# Patient Record
Sex: Female | Born: 1935
Health system: Southern US, Community
[De-identification: ages and names within clinical notes are randomized; demographics above are authoritative.]

## PROBLEM LIST (undated history)

## (undated) DIAGNOSIS — E039 Hypothyroidism, unspecified: Secondary | ICD-10-CM

## (undated) HISTORY — PX: NO PAST SURGERIES: SHX2092

## (undated) HISTORY — PX: BREAST BIOPSY: SHX20

---

## 2011-09-20 ENCOUNTER — Ambulatory Visit
Admission: RE | Admit: 2011-09-20 | Discharge: 2011-09-20 | Disposition: A | Discharge status: Home / Self Care | Payer: Medicare Other | Source: Ambulatory Visit | Attending: Internal Medicine | Admitting: Internal Medicine

## 2011-09-20 ENCOUNTER — Other Ambulatory Visit: Payer: Self-pay | Admitting: Internal Medicine

## 2011-09-20 DIAGNOSIS — M25519 Pain in unspecified shoulder: Secondary | ICD-10-CM

## 2013-01-07 ENCOUNTER — Ambulatory Visit: Payer: Self-pay | Admitting: *Deleted

## 2014-03-28 ENCOUNTER — Ambulatory Visit: Payer: Self-pay | Admitting: Physician Assistant

## 2016-03-27 ENCOUNTER — Ambulatory Visit
Admission: EM | Admit: 2016-03-27 | Discharge: 2016-03-27 | Disposition: A | Discharge status: Home / Self Care | Payer: Medicare Other | Attending: Family Medicine | Admitting: Family Medicine

## 2016-03-27 DIAGNOSIS — T148 Other injury of unspecified body region: Secondary | ICD-10-CM

## 2016-03-27 DIAGNOSIS — W57XXXA Bitten or stung by nonvenomous insect and other nonvenomous arthropods, initial encounter: Secondary | ICD-10-CM | POA: Diagnosis not present

## 2016-03-27 DIAGNOSIS — S80862A Insect bite (nonvenomous), left lower leg, initial encounter: Secondary | ICD-10-CM

## 2016-03-27 HISTORY — DX: Hypothyroidism, unspecified: E03.9

## 2016-03-27 MED ORDER — DOXYCYCLINE HYCLATE 100 MG PO CAPS: 100.0000 mg | ORAL_CAPSULE | Freq: Two times a day (BID) | ORAL | Status: DC

## 2016-03-27 NOTE — ED Notes (Signed)
Patient complains of a tick bite that is still embedded in skin. Located on left leg. Patient states that she first noticed the tick last week but, thought that it may be a mole.

## 2016-03-27 NOTE — ED Provider Notes (Signed)
CSN: [id number]     Arrival date & time 03/27/16  1452 History   First MD Initiated Contact with Patient 03/27/16 1512     Chief Complaint  Patient presents with  . Tick Removal   (Consider location/radiation/quality/duration/timing/severity/associated sxs/prior Treatment) HPI Comments: Patient presents with a tick still attached to left inner calf near knee for over 1 week. Has tried to use alcohol and other liquids to remove tick. Very itchy and now tick slightly embedded into skin with some bleeding and more open wound. No signs of redness or infection surrounding tick bite. No fever or unusual arthralgias.   The history is provided by the patient.    Past Medical History  Diagnosis Date  . Hypothyroid    Past Surgical History  Procedure Laterality Date  . No past surgeries     History reviewed. No pertinent family history. Social History  Substance Use Topics  . Smoking status: Never Smoker   . Smokeless tobacco: None  . Alcohol Use: No   OB History    No data available     Review of Systems  Constitutional: Negative for fever and fatigue.  Skin: Positive for wound.    Allergies  Review of patient's allergies indicates no known allergies.  Home Medications   Prior to Admission medications   Medication Sig Start Date End Date Taking? Authorizing Provider  levothyroxine (SYNTHROID, LEVOTHROID) 112 MCG tablet Take 112 mcg by mouth daily before breakfast.   Yes Historical Provider, MD  doxycycline (VIBRAMYCIN) 100 MG capsule Take 1 capsule (100 mg total) by mouth 2 (two) times daily. For 1 day 03/27/16   Sudie GrumblingAnn Berry Nakai Yard, NP   Meds Ordered and Administered this Visit  Medications - No data to display  BP 134/60 mmHg  Pulse 91  Temp(Src) 98 F (36.7 C) (Tympanic)  Resp 16  Ht 5\' 9"  (1.753 m)  SpO2 98% No data found.   Physical Exam  Constitutional: She is oriented to person, place, and time. She appears well-developed and well-nourished.  Cardiovascular:  Normal rate, regular rhythm and normal heart sounds.   Neurological: She is alert and oriented to person, place, and time.  Skin: Skin is warm and dry.     Open area on left inner upper calf near knee about 3mm in diameter. Tick still present in wound. Slight healing of skin around front of tick. No redness or swelling. Minimal tenderness.      ED Course  Procedures (including critical care time) Cleaned area with alcohol. Used tweezers to remove tick from skin/wound intact. Applied antibiotic ointment and covered with bandaid.   Labs Review Labs Reviewed - No data to display  Imaging Review No results found.   Visual Acuity Review  Right Eye Distance:   Left Eye Distance:   Bilateral Distance:    Right Eye Near:   Left Eye Near:    Bilateral Near:         MDM   1. Tick bite of calf, left, initial encounter   2. Tick bite with subsequent removal of tick    Discussed since tick has been present for more than 1 week but no signs of rash or infection, that she could take a prophylactic dose of Doxycycline to prevent infection. Prescribed 1 day of Doxycycline. Continue to monitor area for redness, swelling or drainage and if they occur, follow-up with her PCP for further evaluation.     Sudie GrumblingAnn Berry Mahlani Berninger, NP 03/27/16 [phone]

## 2016-03-27 NOTE — Discharge Instructions (Signed)
Take Doxycycline twice a day for 1 day to prevent infection. If redness, swelling or discharge occur at area of tick bite, need to see primary care provider for further evaluation.  Tick Bite Information Ticks are insects that attach themselves to the skin and draw blood for food. There are various types of ticks. Common types include wood ticks and deer ticks. Most ticks live in shrubs and grassy areas. Ticks can climb onto your body when you make contact with leaves or grass where the tick is waiting. The most common places on the body for ticks to attach themselves are the scalp, neck, armpits, waist, and groin. Most tick bites are harmless, but sometimes ticks carry germs that cause diseases. These germs can be spread to a person during the tick's feeding process. The chance of a disease spreading through a tick bite depends on:   The type of tick.  Time of year.   How long the tick is attached.   Geographic location.  HOW CAN YOU PREVENT TICK BITES? Take these steps to help prevent tick bites when you are outdoors:  Wear protective clothing. Long sleeves and long pants are best.   Wear white clothes so you can see ticks more easily.  Tuck your pant legs into your socks.   If walking on a trail, stay in the middle of the trail to avoid brushing against bushes.  Avoid walking through areas with long grass.  Put insect repellent on all exposed skin and along boot tops, pant legs, and sleeve cuffs.   Check clothing, hair, and skin repeatedly and before going inside.   Brush off any ticks that are not attached.  Take a shower or bath as soon as possible after being outdoors.  WHAT IS THE PROPER WAY TO REMOVE A TICK? Ticks should be removed as soon as possible to help prevent diseases caused by tick bites. 1. If latex gloves are available, put them on before trying to remove a tick.  2. Using fine-point tweezers, grasp the tick as close to the skin as possible. You may  also use curved forceps or a tick removal tool. Grasp the tick as close to its head as possible. Avoid grasping the tick on its body. 3. Pull gently with steady upward pressure until the tick lets go. Do not twist the tick or jerk it suddenly. This may break off the tick's head or mouth parts. 4. Do not squeeze or crush the tick's body. This could force disease-carrying fluids from the tick into your body.  5. After the tick is removed, wash the bite area and your hands with soap and water or other disinfectant such as alcohol. 6. Apply a small amount of antiseptic cream or ointment to the bite site.  7. Wash and disinfect any instruments that were used.  Do not try to remove a tick by applying a hot match, petroleum jelly, or fingernail polish to the tick. These methods do not work and may increase the chances of disease being spread from the tick bite.  WHEN SHOULD YOU SEEK MEDICAL CARE? Contact your health care provider if you are unable to remove a tick from your skin or if a part of the tick breaks off and is stuck in the skin.  After a tick bite, you need to be aware of signs and symptoms that could be related to diseases spread by ticks. Contact your health care provider if you develop any of the following in the days or weeks  after the tick bite:  Unexplained fever.  Rash. A circular rash that appears days or weeks after the tick bite may indicate the possibility of Lyme disease. The rash may resemble a target with a bull's-eye and may occur at a different part of your body than the tick bite.  Redness and swelling in the area of the tick bite.   Tender, swollen lymph glands.   Diarrhea.   Weight loss.   Cough.   Fatigue.   Muscle, joint, or bone pain.   Abdominal pain.   Headache.   Lethargy or a change in your level of consciousness.  Difficulty walking or moving your legs.   Numbness in the legs.   Paralysis.  Shortness of breath.   Confusion.    Repeated vomiting.    This information is not intended to replace advice given to you by your health care provider. Make sure you discuss any questions you have with your health care provider.   Document Released: 09/16/2000 Document Revised: 10/10/2014 Document Reviewed: 02/27/2013 Elsevier Interactive Patient Education [employer]2016 Elsevier Inc.

## 2017-06-12 DIAGNOSIS — M179 Osteoarthritis of knee, unspecified: Secondary | ICD-10-CM | POA: Insufficient documentation

## 2017-06-12 DIAGNOSIS — M1710 Unilateral primary osteoarthritis, unspecified knee: Secondary | ICD-10-CM | POA: Insufficient documentation

## 2017-09-20 ENCOUNTER — Other Ambulatory Visit: Payer: Self-pay | Admitting: Nurse Practitioner

## 2017-09-20 MED ORDER — LEVOTHYROXINE SODIUM 50 MCG PO TABS: 50.0000 ug | ORAL_TABLET | Freq: Every day | ORAL | 1 refills | Status: DC

## 2017-11-06 ENCOUNTER — Ambulatory Visit: Payer: Medicare Other | Admitting: Nurse Practitioner

## 2017-11-06 ENCOUNTER — Encounter: Payer: Self-pay | Admitting: Nurse Practitioner

## 2017-11-06 VITALS — BP 122/60 | HR 70 | Resp 16 | Ht 68.0 in | Wt 222.6 lb

## 2017-11-06 DIAGNOSIS — N390 Urinary tract infection, site not specified: Secondary | ICD-10-CM

## 2017-11-06 DIAGNOSIS — E039 Hypothyroidism, unspecified: Secondary | ICD-10-CM

## 2017-11-06 DIAGNOSIS — Z0001 Encounter for general adult medical examination with abnormal findings: Secondary | ICD-10-CM

## 2017-11-06 LAB — POCT URINALYSIS DIPSTICK
Bilirubin, UA: NEGATIVE
Blood, UA: NEGATIVE
Glucose, UA: NEGATIVE
KETONES UA: NEGATIVE
NITRITE UA: NEGATIVE
PH UA: 6.5 (ref 5.0–8.0)
PROTEIN UA: NEGATIVE
Spec Grav, UA: <=1.005 — AB (ref 1.010–1.025)
UROBILINOGEN UA: 0.2 U/dL

## 2017-11-06 NOTE — Progress Notes (Signed)
[facility] [address] [place], [place] [address]  Internal MEDICINE  Office Visit Note  Patient Name: Karen Hess  [id number]  [id number]  Date of Service: 11/12/2017  No chief complaint on file.    The patient is here for routine health maintenance exam. She has no concerns or complaints today. She is due to have thyroid levels checked.    Pt is here for routine health maintenance examination  Current Medication: Outpatient Encounter Medications as of 11/06/2017  Medication Sig  . levothyroxine (SYNTHROID, LEVOTHROID) 50 MCG tablet Take 1 tablet (50 mcg total) by mouth daily before breakfast.  . liothyronine (CYTOMEL) 25 MCG tablet Take 25 mcg by mouth daily. Take 1/2 tab daily.  . [DISCONTINUED] doxycycline (VIBRAMYCIN) 100 MG capsule Take 1 capsule (100 mg total) by mouth 2 (two) times daily. For 1 day  . amoxicillin-clavulanate (AUGMENTIN) 875-125 MG tablet Take 1 tablet by mouth 2 (two) times daily.   No facility-administered encounter medications on file as of 11/06/2017.     Surgical History: Past Surgical History:  Procedure Laterality Date  . BREAST BIOPSY    . NO PAST SURGERIES      Medical History: Past Medical History:  Diagnosis Date  . Hypothyroid     Family History: Family History  Problem Relation Age of Onset  . Arthritis Mother       Review of Systems  Constitutional: Negative for activity change, appetite change, fatigue and unexpected weight change.  HENT: Negative for postnasal drip, rhinorrhea, sore throat and voice change.   Eyes: Negative.   Respiratory: Negative for cough and wheezing.   Cardiovascular: Negative for chest pain and palpitations.  Endocrine: Negative for cold intolerance, heat intolerance, polydipsia, polyphagia and polyuria.  Musculoskeletal: Negative for arthralgias, gait problem and myalgias.  Allergic/Immunologic: Negative for environmental allergies, food allergies and immunocompromised state.   Neurological: Negative for weakness and headaches.  Hematological: Negative.      Today's Vitals   11/06/17 1424  BP: 122/60  Pulse: 70  Resp: 16  SpO2: 97%  Weight: 222 lb 9.6 oz (101 kg)  Height: 5\' 8"  (1.727 m)    Physical Exam  Constitutional: She is oriented to person, place, and time. She appears well-developed and well-nourished. No distress.  HENT:  Head: Normocephalic and atraumatic.  Mouth/Throat: Oropharynx is clear and moist. No oropharyngeal exudate.  Eyes: EOM are normal. Pupils are equal, round, and reactive to light.  Neck: Normal range of motion. Neck supple. No JVD present. Carotid bruit is not present. No tracheal deviation present. No thyromegaly present.  Cardiovascular: Normal rate, regular rhythm, normal heart sounds and intact distal pulses. Exam reveals no gallop and no friction rub.  No murmur heard. Pulmonary/Chest: Effort normal and breath sounds normal. No respiratory distress. She has no wheezes. She has no rales. She exhibits no tenderness.  Abdominal: Soft. Bowel sounds are normal. There is no tenderness.  Musculoskeletal: Normal range of motion.  Lymphadenopathy:    She has no cervical adenopathy.  Neurological: She is alert and oriented to person, place, and time. No cranial nerve deficit.  Skin: Skin is warm and dry. She is not diaphoretic.  Psychiatric: She has a normal mood and affect. Her behavior is normal. Judgment and thought content normal.  Nursing note and vitals reviewed.    LABS: Recent Results (from the past 2160 hour(s))  POCT Urinalysis Dipstick     Status: Abnormal   Collection Time: 11/06/17  2:41 PM  Result Value Ref Range  Color, UA     Clarity, UA     Glucose, UA negative    Bilirubin, UA negative    Ketones, UA negative    Spec Grav, UA <=1.005 (A) 1.010 - 1.025   Blood, UA negative    pH, UA 6.5 5.0 - 8.0   Protein, UA negative    Urobilinogen, UA 0.2 0.2 or 1.0 E.U./dL   Nitrite, UA negative    Leukocytes,  UA Moderate (2+) (A) Negative   Appearance     Odor    CULTURE, URINE COMPREHENSIVE     Status: None   Collection Time: 11/06/17  4:36 PM  Result Value Ref Range   Urine Culture, Comprehensive Final report    Organism ID, Bacteria Comment     Comment: Mixed urogenital flora 10,000-25,000 colony forming units per mL     Assessment/Plan:  1. Encounter for general adult medical examination with abnormal findings Annual wellness visit today - POCT Urinalysis Dipstick  2. Acquired hypothyroidism - TSH + free T4 - T3 Adjust thyroid medications as indicated.   She should return to the office in 6 months and sooner if needed.    General Counseling: Karen LeavensBarbara verbalizes understanding of the findings of todays visit and agrees with plan of treatment. I have discussed any further diagnostic evaluation that may be needed or ordered today. We also reviewed her medications today. she has been encouraged to call the office with any questions or concerns that should arise related to todays visit.   This patient was seen by Karen GrosHeather Suzy Kugel, FNP- C in Collaboration with Dr Lyndon CodeFozia M Khan as a part of collaborative care agreement  Orders Placed This Encounter  Procedures  . CULTURE, URINE COMPREHENSIVE  . TSH + free T4  . T3  . POCT Urinalysis Dipstick      Time spent: 7430 Minutes   Lyndon CodeFozia M Khan, MD  Internal Medicine

## 2017-11-09 LAB — CULTURE, URINE COMPREHENSIVE

## 2017-11-12 MED ORDER — AMOXICILLIN-POT CLAVULANATE 875-125 MG PO TABS: 1.0000 | ORAL_TABLET | Freq: Two times a day (BID) | ORAL | 0 refills | Status: DC

## 2017-11-15 ENCOUNTER — Telehealth: Payer: Self-pay

## 2017-11-15 DIAGNOSIS — E039 Hypothyroidism, unspecified: Secondary | ICD-10-CM | POA: Insufficient documentation

## 2017-11-15 DIAGNOSIS — N390 Urinary tract infection, site not specified: Secondary | ICD-10-CM | POA: Insufficient documentation

## 2017-11-15 NOTE — Telephone Encounter (Signed)
Pt advised for urine culure result

## 2017-11-15 NOTE — Telephone Encounter (Signed)
-----   Message from Carlean JewsHeather E Boscia, NP sent at 11/12/2017  7:18 PM EST ----- Please let the patient know that urine sample, taken during her last appointment did show evidence of infection. Just normal flora. Added round of augmentin 875mg  twice daily for 10 days. Sent to her pharmacy. thanks

## 2018-01-22 ENCOUNTER — Other Ambulatory Visit: Payer: Self-pay

## 2018-01-22 MED ORDER — MELOXICAM 7.5 MG PO TABS: ORAL_TABLET | ORAL | 2 refills | Status: DC

## 2018-02-05 ENCOUNTER — Ambulatory Visit
Admission: RE | Admit: 2018-02-05 | Discharge: 2018-02-05 | Disposition: A | Discharge status: Home / Self Care | Payer: Medicare Other | Source: Ambulatory Visit | Attending: Nurse Practitioner | Admitting: Nurse Practitioner

## 2018-02-05 ENCOUNTER — Ambulatory Visit: Payer: Medicare Other | Admitting: Nurse Practitioner

## 2018-02-05 ENCOUNTER — Encounter: Payer: Self-pay | Admitting: Nurse Practitioner

## 2018-02-05 VITALS — BP 111/69 | HR 74 | Resp 16 | Ht 69.0 in | Wt 226.0 lb

## 2018-02-05 DIAGNOSIS — B352 Tinea manuum: Secondary | ICD-10-CM

## 2018-02-05 DIAGNOSIS — B353 Tinea pedis: Secondary | ICD-10-CM | POA: Diagnosis not present

## 2018-02-05 DIAGNOSIS — M25561 Pain in right knee: Secondary | ICD-10-CM

## 2018-02-05 DIAGNOSIS — B351 Tinea unguium: Secondary | ICD-10-CM | POA: Insufficient documentation

## 2018-02-05 DIAGNOSIS — M7989 Other specified soft tissue disorders: Secondary | ICD-10-CM | POA: Insufficient documentation

## 2018-02-05 MED ORDER — TERBINAFINE HCL 1 % EX CREA: 1.0000 "application " | TOPICAL_CREAM | Freq: Two times a day (BID) | CUTANEOUS | 2 refills | Status: DC

## 2018-02-05 NOTE — Progress Notes (Signed)
[facility] [address] [place], [place] [address]  Internal MEDICINE  Office Visit Note  Patient Name: Karen Hess  [id number]  [id number]  Date of Service: 02/05/2018  Chief Complaint  Patient presents with  . Leg Swelling    right leg pain  . Nail Problem    right great toe.     The patient is c/o right leg pain. Hurts from mid thigh down to the right ankle. Pain is most severe in her right knee. There is moderate swelling present. ROM is intact, though she says it hurts to move. She rates pain as 3/10 in severity.  She also states that the toenail of her right great toe has fallen off. There is thick nail bed underneath. Does not hurt or interfere with walking.   Pt is here for a sick visit.     Current Medication:  Outpatient Encounter Medications as of 02/05/2018  Medication Sig  . amoxicillin-clavulanate (AUGMENTIN) 875-125 MG tablet Take 1 tablet by mouth 2 (two) times daily. (Patient not taking: Reported on 02/05/2018)  . levothyroxine (SYNTHROID, LEVOTHROID) 50 MCG tablet Take 1 tablet (50 mcg total) by mouth daily before breakfast.  . liothyronine (CYTOMEL) 25 MCG tablet Take 25 mcg by mouth daily. Take 1/2 tab daily.  . meloxicam (MOBIC) 7.5 MG tablet TAKE 1 TAB PO TWICE A DAY  . terbinafine (LAMISIL) 1 % cream Apply 1 application topically 2 (two) times daily.   No facility-administered encounter medications on file as of 02/05/2018.       Medical History: Past Medical History:  Diagnosis Date  . Hypothyroid      Vital Signs: BP 111/69   Pulse 74   Resp 16   Ht  (1.753 m)   Wt 226 lb (102.5 kg)   SpO2 95%   BMI 33.37 kg/m    Review of Systems  Constitutional: Negative for activity change, chills, fatigue and unexpected weight change.  HENT: Negative for congestion, postnasal drip, rhinorrhea, sneezing and sore throat.   Eyes: Negative.  Negative for redness.  Respiratory: Negative for cough, shortness of breath and wheezing.    Cardiovascular: Negative for chest pain and palpitations.  Gastrointestinal: Negative for abdominal pain, constipation, diarrhea, nausea and vomiting.  Endocrine: Negative for cold intolerance, heat intolerance, polydipsia, polyphagia and polyuria.  Genitourinary: Negative for dysuria and frequency.  Musculoskeletal: Positive for arthralgias and myalgias. Negative for back pain, joint swelling and neck pain.  Skin: Negative for rash.       Toenail from right great toe has fallen off. Remaining nail is thick and discolored.   Allergic/Immunologic: Negative for environmental allergies.  Neurological: Negative for dizziness, tremors, numbness and headaches.  Hematological: Negative for adenopathy. Does not bruise/bleed easily.  Psychiatric/Behavioral: Negative for behavioral problems (Depression), dysphoric mood, sleep disturbance and suicidal ideas. The patient is not nervous/anxious.     Physical Exam  Constitutional: She is oriented to person, place, and time. She appears well-developed and well-nourished.  HENT:  Head: Normocephalic and atraumatic.  Eyes: Pupils are equal, round, and reactive to light. Conjunctivae and EOM are normal.  Neck: Normal range of motion. Neck supple.  Cardiovascular: Normal rate, regular rhythm, normal heart sounds and intact distal pulses.  Pulmonary/Chest: Effort normal and breath sounds normal.  Abdominal: Soft. Bowel sounds are normal. There is no tenderness.  Musculoskeletal:  Moderate pain and swelling of right knee. There is more significant swelling along medial aspect of the knee. There is palpable collection of fluid just  posterior of the right knee. Very tender with palpation. Patient can bend and flex the knee without issue. No limp noted with walking.   Neurological: She is alert and oriented to person, place, and time. No cranial nerve deficit.  Skin: Skin is warm and dry.  Great toenail of right foot is very thick and discolored. No erythema or  warmth around the nail or nailbed.   Psychiatric: She has a normal mood and affect. Her behavior is normal. Judgment and thought content normal.  Nursing note and vitals reviewed.   Assessment/Plan: 1. Pain in joint of right knee Recommended more regular use of meloxicam to reduce pain and inflammation of knee. Rest and ice the knee when possible. Will get x-ray for further evaluation. Refer to orthopedics as indicated.  - DG Knee Complete 4 Views Right; Future  2. Tinea manuum, pedis, and unguium - terbinafine (LAMISIL) 1 % cream; Apply 1 application topically 2 (two) times daily.  Dispense: 30 g; Refill: 2   General Counseling: Karen Hess verbalizes understanding of the findings of todays visit and agrees with plan of treatment. I have discussed any further diagnostic evaluation that may be needed or ordered today. We also reviewed her medications today. she has been encouraged to call the office with any questions or concerns that should arise related to todays visit.  Apply a compressive ACE bandage. Rest and elevate the affected painful area.  Apply cold compresses intermittently as needed.  As pain recedes, begin normal activities slowly as tolerated.  Call if symptoms persist.  This patient was seen by Vincent Gros, FNP- C in Collaboration with Dr Lyndon Code as a part of collaborative care agreement    Orders Placed This Encounter  Procedures  . DG Knee Complete 4 Views Right    Meds ordered this encounter  Medications  . terbinafine (LAMISIL) 1 % cream    Sig: Apply 1 application topically 2 (two) times daily.    Dispense:  30 g    Refill:  2    Order Specific Question:   Supervising Provider    Answer:   Lyndon Code [[id number]]    Time spent: 15 Minutes

## 2018-02-27 ENCOUNTER — Other Ambulatory Visit: Payer: Self-pay | Admitting: Nurse Practitioner

## 2018-02-27 MED ORDER — LIOTHYRONINE SODIUM 25 MCG PO TABS: 25.0000 ug | ORAL_TABLET | Freq: Every day | ORAL | 2 refills | Status: DC

## 2018-03-27 ENCOUNTER — Other Ambulatory Visit: Payer: Self-pay | Admitting: Nurse Practitioner

## 2018-03-27 MED ORDER — LEVOTHYROXINE SODIUM 50 MCG PO TABS: 50.0000 ug | ORAL_TABLET | Freq: Every day | ORAL | 3 refills | Status: DC

## 2018-04-23 ENCOUNTER — Ambulatory Visit: Payer: Medicare Other | Admitting: Adult Health

## 2018-04-23 ENCOUNTER — Encounter: Payer: Self-pay | Admitting: Adult Health

## 2018-04-23 VITALS — BP 132/70 | HR 90 | Temp 97.7°F | Resp 18 | Ht 68.5 in | Wt 215.4 lb

## 2018-04-23 DIAGNOSIS — M542 Cervicalgia: Secondary | ICD-10-CM | POA: Diagnosis not present

## 2018-04-23 DIAGNOSIS — W57XXXA Bitten or stung by nonvenomous insect and other nonvenomous arthropods, initial encounter: Secondary | ICD-10-CM

## 2018-04-23 MED ORDER — DOXYCYCLINE HYCLATE 100 MG PO TABS: 100.0000 mg | ORAL_TABLET | Freq: Two times a day (BID) | ORAL | 0 refills | Status: DC

## 2018-04-23 NOTE — Patient Instructions (Signed)
Place neck pain patient instructions here. Tick Bite Information, Adult Ticks are insects that can bite. Most ticks live in shrubs and grassy areas. They climb onto people and animals that go by. Then they bite. Some ticks carry germs that can make you sick. How can I prevent tick bites?  Use an insect repellent that has 20% or higher of the ingredients DEET, picaridin, or IR3535. Put this insect repellent on: ? Bare skin. ? The tops of your boots. ? Your pant legs. ? The ends of your sleeves.  If you use an insect repellent that has the ingredient permethrin, make sure to follow the instructions on the bottle. Treat the following: ? Clothing. ? Supplies. ? Boots. ? Tents.  Wear long sleeves, long pants, and light colors.  Tuck your pant legs into your socks.  Stay in the middle of the trail.  Try not to walk through long grass.  Before going inside your house, check your clothes, hair, and skin for ticks. Make sure to check your head, neck, armpits, waist, groin, and joint areas.  Check for ticks every day.  When you come indoors: ? Wash your clothes right away. ? Shower right away. ? Dry your clothes in a dryer on high heat for 60 minutes or more. What is the right way to remove a tick? Remove a tick from your skin as soon as possible.  To remove a tick that is crawling on your skin: ? Go outdoors and brush the tick off. ? Use tape or a lint roller.  To remove a tick that is biting: ? Wash your hands. ? If you have latex gloves, put them on. ? Use tweezers, curved forceps, or a tick-removal tool to grasp the tick. Grasp the tick as close to your skin and as close to the tick's head as possible. ? Gently pull up until the tick lets go.  Try to keep the tick's head attached to its body.  Do not twist or jerk the tick.  Do not squeeze or crush the tick.  Do not try to remove a tick with heat, alcohol, petroleum jelly, or fingernail polish. How should I get rid of a  tick? Here are some ways to get rid of a tick that is alive:  Place the tick in rubbing alcohol.  Place the tick in a bag or container you can close tightly.  Wrap the tick tightly in tape.  Flush the tick down the toilet.  Contact a doctor if:  You have symptoms of a disease, such as: ? Pain in a muscle, joint, or bone. ? Trouble walking or moving your legs. ? Numbness in your legs. ? Inability to move (paralysis). ? A red rash that makes a circle (bull's-eye rash). ? Redness and swelling where the tick bit you. ? A fever. ? Throwing up (vomiting) over and over. ? Diarrhea. ? Weight loss. ? Tender and swollen lymph glands. ? Shortness of breath. ? Cough. ? Belly pain (abdominal pain). ? Headache. ? Being more tired than normal. ? A change in how alert (conscious) you are. ? Confusion. Get help right away if:  You cannot remove a tick.  A part of a tick breaks off and gets stuck in your skin.  You are feeling worse. Summary  Ticks may carry germs that can make you sick.  To prevent tick bites, wear long sleeves, long pants, and light colors. Use insect repellent. Follow the instructions on the bottle.  If the tick  is biting, do not try to remove it with heat, alcohol, petroleum jelly, or fingernail polish.  Use tweezers, curved forceps, or a tick-removal tool to grasp the tick. Gently pull up until the tick lets go. Do not twist or jerk the tick. Do not squeeze or crush the tick.  If you have symptoms, contact a doctor. This information is not intended to replace advice given to you by your health care provider. Make sure you discuss any questions you have with your health care provider. Document Released: 12/14/2009 Document Revised: 12/30/2016 Document Reviewed: 12/30/2016 Elsevier Interactive Patient Education  2018 [employer].

## 2018-04-23 NOTE — Progress Notes (Signed)
[facility] [address] [place], [place] [address]  Internal MEDICINE  Office Visit Note  Patient Name: Karen Hess  [id number]  [id number]  Date of Service: 04/29/2018  Chief Complaint  Patient presents with  . Neck Pain    shoulder , and knee pain, dont know if was bit by a tick    HPI Pt is here for a sick visit. She reports she thinks she has a tick bite to her right leg.  She has a small erythematous site present.  She also reports neck pain that has been intermittent x 3 weeks.  It especially gets better through out the day.    Current Medication:  Outpatient Encounter Medications as of 04/23/2018  Medication Sig  . levothyroxine (SYNTHROID, LEVOTHROID) 50 MCG tablet Take 1 tablet (50 mcg total) by mouth daily before breakfast.  . meloxicam (MOBIC) 7.5 MG tablet TAKE 1 TAB PO TWICE A DAY  . terbinafine (LAMISIL) 1 % cream Apply 1 application topically 2 (two) times daily.  . [DISCONTINUED] liothyronine (CYTOMEL) 25 MCG tablet Take 1 tablet (25 mcg total) by mouth daily. Take 1/2 tab daily.  Marland Kitchen doxycycline (VIBRA-TABS) 100 MG tablet Take 1 tablet (100 mg total) by mouth 2 (two) times daily.  . [DISCONTINUED] amoxicillin-clavulanate (AUGMENTIN) 875-125 MG tablet Take 1 tablet by mouth 2 (two) times daily. (Patient not taking: Reported on 02/05/2018)   No facility-administered encounter medications on file as of 04/23/2018.    Medical History: Past Medical History:  Diagnosis Date  . Hypothyroid      Vital Signs: BP 132/70   Pulse 90   Temp 97.7 F (36.5 C)   Resp 18   Ht 5' 8.5" (1.74 m)   Wt 215 lb 6.4 oz (97.7 kg)   SpO2 94%   BMI 32.28 kg/m    Review of Systems  Constitutional: Negative for chills, fatigue and unexpected weight change.  HENT: Negative for congestion, rhinorrhea, sneezing and sore throat.   Eyes: Negative for photophobia, pain and redness.  Respiratory: Negative for cough, chest tightness and shortness of breath.    Cardiovascular: Negative for chest pain and palpitations.  Gastrointestinal: Negative for abdominal pain, constipation, diarrhea, nausea and vomiting.  Endocrine: Negative.   Genitourinary: Negative for dysuria and frequency.  Musculoskeletal: Negative for arthralgias, back pain, joint swelling and neck pain.  Skin: Negative for rash.  Allergic/Immunologic: Negative.   Neurological: Negative for tremors and numbness.  Hematological: Negative for adenopathy. Does not bruise/bleed easily.  Psychiatric/Behavioral: Negative for behavioral problems and sleep disturbance. The patient is not nervous/anxious.     Physical Exam  Constitutional: She is oriented to person, place, and time. She appears well-developed and well-nourished. No distress.  HENT:  Head: Normocephalic and atraumatic.  Mouth/Throat: Oropharynx is clear and moist. No oropharyngeal exudate.  Eyes: Pupils are equal, round, and reactive to light. EOM are normal.  Neck: Normal range of motion. Neck supple. No JVD present. No tracheal deviation present. No thyromegaly present.  Cardiovascular: Normal rate, regular rhythm and normal heart sounds. Exam reveals no gallop and no friction rub.  No murmur heard. Pulmonary/Chest: Effort normal and breath sounds normal. No respiratory distress. She has no wheezes. She has no rales. She exhibits no tenderness.  Abdominal: Soft. There is no tenderness. There is no guarding.  Musculoskeletal: Normal range of motion.  Lymphadenopathy:    She has no cervical adenopathy.  Neurological: She is alert and oriented to person, place, and time. No cranial nerve deficit.  Skin: Skin is warm and dry. She is not diaphoretic.  Psychiatric: She has a normal mood and affect. Her behavior is normal. Judgment and thought content normal.  Nursing note and vitals reviewed.  Assessment/Plan: 1. Tick bite, initial encounter Counciled pt to take with food.  - doxycycline (VIBRA-TABS) 100 MG tablet; Take 1  tablet (100 mg total) by mouth 2 (two) times daily.  Dispense: 28 tablet; Refill: 0  2. Neck pain Encouraged pt to use topical pain relief such as Icy hot, or Biofreeze.  Use hot pack for no more than 20 minutes per session. Also discussed NSAID use, pt declined to take them at this time.  Pt also declined X-ray a this time.     General Counseling: Karen LeavensBarbara verbalizes understanding of the findings of todays visit and agrees with plan of treatment. I have discussed any further diagnostic evaluation that may be needed or ordered today. We also reviewed her medications today. she has been encouraged to call the office with any questions or concerns that should arise related to todays visit.   No orders of the defined types were placed in this encounter.   Meds ordered this encounter  Medications  . doxycycline (VIBRA-TABS) 100 MG tablet    Sig: Take 1 tablet (100 mg total) by mouth 2 (two) times daily.    Dispense:  28 tablet    Refill:  0    Time spent: 25 Minutes  This patient was seen by Blima LedgerAdam Chrles Selley AGNP-C in Collaboration with Dr Lyndon CodeFozia M Khan as a part of collaborative care agreement

## 2018-04-25 ENCOUNTER — Other Ambulatory Visit: Payer: Self-pay | Admitting: Nurse Practitioner

## 2018-04-25 ENCOUNTER — Other Ambulatory Visit: Payer: Self-pay

## 2018-04-25 DIAGNOSIS — E039 Hypothyroidism, unspecified: Secondary | ICD-10-CM

## 2018-04-25 MED ORDER — LIOTHYRONINE SODIUM 25 MCG PO TABS: 12.5000 ug | ORAL_TABLET | Freq: Every day | ORAL | 3 refills | Status: DC

## 2018-04-25 NOTE — Progress Notes (Signed)
Updated her rx for liothyronine. Taking 12.5mg  (1/2 tablet daily).

## 2018-05-07 ENCOUNTER — Ambulatory Visit: Payer: Self-pay | Admitting: Nurse Practitioner

## 2018-05-07 ENCOUNTER — Encounter: Payer: Self-pay | Admitting: Nurse Practitioner

## 2018-05-07 ENCOUNTER — Ambulatory Visit: Payer: Medicare Other | Admitting: Nurse Practitioner

## 2018-05-07 VITALS — BP 106/61 | HR 82 | Resp 16 | Ht 68.5 in | Wt 215.0 lb

## 2018-05-07 DIAGNOSIS — S80861D Insect bite (nonvenomous), right lower leg, subsequent encounter: Principal | ICD-10-CM

## 2018-05-07 DIAGNOSIS — L089 Local infection of the skin and subcutaneous tissue, unspecified: Secondary | ICD-10-CM

## 2018-05-07 DIAGNOSIS — W57XXXD Bitten or stung by nonvenomous insect and other nonvenomous arthropods, subsequent encounter: Principal | ICD-10-CM

## 2018-05-07 MED ORDER — MUPIROCIN 2 % EX OINT: TOPICAL_OINTMENT | CUTANEOUS | 1 refills | Status: DC

## 2018-05-07 NOTE — Progress Notes (Signed)
[facility] [address] [place], [place] [address]  Internal MEDICINE  Office Visit Note  Patient Name: Karen Hess  [id number]  [id number]  Date of Service: 05/15/2018  Chief Complaint  Patient presents with  . Insect Bite    follow up    The patient has small, round, red lesion on the lower aspect of her right leg. Noticed it last week. States that it does not hurt and does not itch. Was seen for this last week. Was started on doxycycline. She states that the lesion is unchanged. Has not improved, but has not gotten any worse.       Current Medication: Outpatient Encounter Medications as of 05/07/2018  Medication Sig  . doxycycline (VIBRA-TABS) 100 MG tablet Take 1 tablet (100 mg total) by mouth 2 (two) times daily.  Marland Kitchen levothyroxine (SYNTHROID, LEVOTHROID) 50 MCG tablet Take 1 tablet (50 mcg total) by mouth daily before breakfast.  . liothyronine (CYTOMEL) 25 MCG tablet Take 0.5 tablets (12.5 mcg total) by mouth daily.  . meloxicam (MOBIC) 7.5 MG tablet TAKE 1 TAB PO TWICE A DAY  . mupirocin ointment (BACTROBAN) 2 % Apply to affected area BID for 10 days  . terbinafine (LAMISIL) 1 % cream Apply 1 application topically 2 (two) times daily.   No facility-administered encounter medications on file as of 05/07/2018.     Surgical History: Past Surgical History:  Procedure Laterality Date  . BREAST BIOPSY    . NO PAST SURGERIES      Medical History: Past Medical History:  Diagnosis Date  . Hypothyroid     Family History: Family History  Problem Relation Age of Onset  . Arthritis Mother     Social History   Socioeconomic History  . Marital status: Single    Spouse name: Not on file  . Number of children: Not on file  . Years of education: Not on file  . Highest education level: Not on file  Occupational History  . Not on file  Social Needs  . Financial resource strain: Not on file  . Food insecurity:    Worry: Not on file    Inability: Not on  file  . Transportation needs:    Medical: Not on file    Non-medical: Not on file  Tobacco Use  . Smoking status: Former Games developer  . Smokeless tobacco: Never Used  Substance and Sexual Activity  . Alcohol use: No    Alcohol/week: 0.0 standard drinks  . Drug use: No  . Sexual activity: Not on file  Lifestyle  . Physical activity:    Days per week: Not on file    Minutes per session: Not on file  . Stress: Not on file  Relationships  . Social connections:    Talks on phone: Not on file    Gets together: Not on file    Attends religious service: Not on file    Active member of club or organization: Not on file    Attends meetings of clubs or organizations: Not on file    Relationship status: Not on file  . Intimate partner violence:    Fear of current or ex partner: Not on file    Emotionally abused: Not on file    Physically abused: Not on file    Forced sexual activity: Not on file  Other Topics Concern  . Not on file  Social History Narrative  . Not on file      Review of Systems  Constitutional:  Negative for activity change, chills, fatigue and unexpected weight change.  HENT: Negative for congestion, rhinorrhea, sneezing and sore throat.   Eyes: Negative.  Negative for photophobia, pain and redness.  Respiratory: Negative for cough, chest tightness, shortness of breath and wheezing.   Cardiovascular: Negative for chest pain and palpitations.  Gastrointestinal: Negative for abdominal pain, constipation, diarrhea, nausea and vomiting.  Endocrine: Negative for cold intolerance, heat intolerance, polydipsia, polyphagia and polyuria.  Genitourinary: Negative.  Negative for dysuria and frequency.  Musculoskeletal: Positive for arthralgias and myalgias. Negative for back pain, joint swelling and neck pain.       Right knee pain. Sees orthopedics for this. Gets cortisone injections every 3 months.   Skin: Negative for rash.       Lesion on right lower leg. No pain or itching    Allergic/Immunologic: Negative.   Neurological: Negative for tremors and numbness.  Hematological: Negative for adenopathy. Does not bruise/bleed easily.  Psychiatric/Behavioral: Negative for behavioral problems and sleep disturbance. The patient is not nervous/anxious.     Today's Vitals   05/07/18 1546  BP: 106/61  Pulse: 82  Resp: 16  SpO2: 94%  Weight: 215 lb (97.5 kg)  Height: 5' 8.5" (1.74 m)    Physical Exam  Constitutional: She is oriented to person, place, and time. She appears well-developed and well-nourished. No distress.  HENT:  Head: Normocephalic and atraumatic.  Nose: Nose normal.  Mouth/Throat: Oropharynx is clear and moist. No oropharyngeal exudate.  Eyes: Pupils are equal, round, and reactive to light. Conjunctivae and EOM are normal.  Neck: Normal range of motion. Neck supple. No JVD present. No tracheal deviation present. No thyromegaly present.  Cardiovascular: Normal rate, regular rhythm and normal heart sounds. Exam reveals no gallop and no friction rub.  No murmur heard. Pulmonary/Chest: Effort normal and breath sounds normal. No respiratory distress. She has no wheezes. She has no rales. She exhibits no tenderness.  Abdominal: Soft. Bowel sounds are normal. There is no tenderness. There is no guarding.  Musculoskeletal: Normal range of motion.  Lymphadenopathy:    She has no cervical adenopathy.  Neurological: She is alert and oriented to person, place, and time. No cranial nerve deficit.  Skin: Skin is warm and dry. Capillary refill takes less than 2 seconds. She is not diaphoretic.     Psychiatric: She has a normal mood and affect. Her behavior is normal. Judgment and thought content normal.  Nursing note and vitals reviewed.  Assessment/Plan: 1. Insect bite of right lower leg with infection, subsequent encounter Oral antibiotics finished without significant improvement of symptoms. Add bactroban ointment. Apply small amount to affected area  twice daily for 10 days. May leave uncovered. Refer to dermatology if no improvement over next 2 weeks.  - mupirocin ointment (BACTROBAN) 2 %; Apply to affected area BID for 10 days  Dispense: 22 g; Refill: 1  General Counseling: Karen Hess verbalizes understanding of the findings of todays visit and agrees with plan of treatment. I have discussed any further diagnostic evaluation that may be needed or ordered today. We also reviewed her medications today. she has been encouraged to call the office with any questions or concerns that should arise related to todays visit.   This patient was seen by Vincent Gros FNP Collaboration with Dr Lyndon Code as a part of collaborative care agreement  Meds ordered this encounter  Medications  . mupirocin ointment (BACTROBAN) 2 %    Sig: Apply to affected area BID for 10 days  Dispense:  22 g    Refill:  1    Order Specific Question:   Supervising Provider    Answer:   Lyndon CodeKHAN, FOZIA M [[id number]]    Time spent: 4815 Minutes      Dr Lyndon CodeFozia M Khan Internal medicine

## 2018-05-15 DIAGNOSIS — S80861A Insect bite (nonvenomous), right lower leg, initial encounter: Secondary | ICD-10-CM

## 2018-05-15 DIAGNOSIS — W57XXXA Bitten or stung by nonvenomous insect and other nonvenomous arthropods, initial encounter: Secondary | ICD-10-CM

## 2018-05-15 DIAGNOSIS — L089 Local infection of the skin and subcutaneous tissue, unspecified: Secondary | ICD-10-CM | POA: Insufficient documentation

## 2018-05-31 ENCOUNTER — Other Ambulatory Visit: Payer: Self-pay | Admitting: Nurse Practitioner

## 2018-05-31 MED ORDER — MELOXICAM 7.5 MG PO TABS: ORAL_TABLET | ORAL | 2 refills | Status: DC

## 2018-08-24 ENCOUNTER — Other Ambulatory Visit: Payer: Self-pay

## 2018-08-24 MED ORDER — MELOXICAM 7.5 MG PO TABS: ORAL_TABLET | ORAL | 2 refills | Status: DC

## 2018-11-09 ENCOUNTER — Ambulatory Visit (INDEPENDENT_AMBULATORY_CARE_PROVIDER_SITE_OTHER): Payer: Medicare Other | Admitting: Nurse Practitioner

## 2018-11-09 ENCOUNTER — Encounter: Payer: Self-pay | Admitting: Nurse Practitioner

## 2018-11-09 VITALS — BP 130/76 | HR 72 | Resp 16 | Ht 68.0 in | Wt 207.2 lb

## 2018-11-09 DIAGNOSIS — Z13820 Encounter for screening for osteoporosis: Secondary | ICD-10-CM | POA: Diagnosis not present

## 2018-11-09 DIAGNOSIS — Z0001 Encounter for general adult medical examination with abnormal findings: Secondary | ICD-10-CM

## 2018-11-09 DIAGNOSIS — M25561 Pain in right knee: Secondary | ICD-10-CM

## 2018-11-09 DIAGNOSIS — E039 Hypothyroidism, unspecified: Secondary | ICD-10-CM | POA: Diagnosis not present

## 2018-11-09 NOTE — Progress Notes (Signed)
[facility] [address] [place], [place] [address]  Internal MEDICINE  Office Visit Note  Patient Name: Karen Hess  [id number]  [id number]  Date of Service: 11/09/2018   Pt is here for routine health maintenance examination  Chief Complaint  Patient presents with  . Medicare Wellness    6 month well visit  . Quality Metric Gaps    bone density,      The patient is here for routine health maintenance exam. She has no concerns or complaints today. She is due to have routine, fasting labs checked as well as bone density test. She does not wish to have mammogram. She continues to have left knee pain and sees orthopedics for management. She gets cortisone injections which help some for short intervals of time.     Current Medication: Outpatient Encounter Medications as of 11/09/2018  Medication Sig  . doxycycline (VIBRA-TABS) 100 MG tablet Take 1 tablet (100 mg total) by mouth 2 (two) times daily.  Marland Kitchen levothyroxine (SYNTHROID, LEVOTHROID) 50 MCG tablet Take 1 tablet (50 mcg total) by mouth daily before breakfast.  . liothyronine (CYTOMEL) 25 MCG tablet Take 0.5 tablets (12.5 mcg total) by mouth daily.  . meloxicam (MOBIC) 7.5 MG tablet TAKE 1 TAB PO TWICE A DAY  . mupirocin ointment (BACTROBAN) 2 % Apply to affected area BID for 10 days  . terbinafine (LAMISIL) 1 % cream Apply 1 application topically 2 (two) times daily.   No facility-administered encounter medications on file as of 11/09/2018.     Surgical History: Past Surgical History:  Procedure Laterality Date  . BREAST BIOPSY    . NO PAST SURGERIES      Medical History: Past Medical History:  Diagnosis Date  . Hypothyroid     Family History: Family History  Problem Relation Age of Onset  . Arthritis Mother       Review of Systems  Constitutional: Negative for activity change, chills, fatigue and unexpected weight change.  HENT: Negative for congestion, rhinorrhea, sneezing and sore throat.    Eyes: Negative.  Negative for photophobia, pain and redness.  Respiratory: Negative for cough, chest tightness, shortness of breath and wheezing.   Cardiovascular: Negative for chest pain and palpitations.  Gastrointestinal: Negative for abdominal pain, constipation, diarrhea, nausea and vomiting.  Endocrine: Negative for cold intolerance, heat intolerance, polydipsia and polyuria.  Musculoskeletal: Positive for arthralgias and myalgias. Negative for back pain, joint swelling and neck pain.       Right knee pain. Sees orthopedics for this. Gets cortisone injections every 3 months.   Skin: Negative for rash.  Allergic/Immunologic: Negative.   Neurological: Negative for dizziness, tremors, numbness and headaches.  Hematological: Negative for adenopathy. Does not bruise/bleed easily.  Psychiatric/Behavioral: Negative for behavioral problems and sleep disturbance. The patient is not nervous/anxious.     Today's Vitals   11/09/18 1010  BP: 130/76  Pulse: 72  Resp: 16  SpO2: 99%  Weight: 207 lb 3.2 oz (94 kg)  Height: 5\' 8"  (1.727 m)   Body mass index is 31.5 kg/m.  Physical Exam Vitals signs and nursing note reviewed.  Constitutional:      General: She is not in acute distress.    Appearance: Normal appearance. She is well-developed. She is not diaphoretic.  HENT:     Head: Normocephalic and atraumatic.     Mouth/Throat:     Pharynx: No oropharyngeal exudate.  Eyes:     Conjunctiva/sclera: Conjunctivae normal.     Pupils: Pupils are  equal, round, and reactive to light.  Neck:     Musculoskeletal: Normal range of motion and neck supple.     Thyroid: No thyromegaly.     Vascular: No carotid bruit or JVD.     Trachea: No tracheal deviation.  Cardiovascular:     Rate and Rhythm: Normal rate and regular rhythm.     Pulses: Normal pulses.     Heart sounds: Normal heart sounds. No murmur. No friction rub. No gallop.   Pulmonary:     Effort: Pulmonary effort is normal. No  respiratory distress.     Breath sounds: Normal breath sounds. No wheezing or rales.  Chest:     Chest wall: No tenderness.     Breasts:        Right: Normal. No swelling, bleeding, inverted nipple, mass, nipple discharge, skin change or tenderness.        Left: Normal. No swelling, bleeding, inverted nipple, mass, nipple discharge, skin change or tenderness.  Abdominal:     General: Bowel sounds are normal.     Palpations: Abdomen is soft.  Musculoskeletal: Normal range of motion.  Lymphadenopathy:     Cervical: No cervical adenopathy.  Skin:    General: Skin is warm and dry.  Neurological:     General: No focal deficit present.     Mental Status: She is alert and oriented to person, place, and time.     Cranial Nerves: No cranial nerve deficit.  Psychiatric:        Behavior: Behavior normal.        Thought Content: Thought content normal.        Judgment: Judgment normal.    Depression screen [facility] 2/9 11/09/2018 04/23/2018 02/05/2018 11/06/2017  Decreased Interest 0 0 0 0  Down, Depressed, Hopeless 0 0 0 0  PHQ - 2 Score 0 0 0 0    Functional Status Survey: Is the patient deaf or have difficulty hearing?: No Does the patient have difficulty seeing, even when wearing glasses/contacts?: No Does the patient have difficulty concentrating, remembering, or making decisions?: No Does the patient have difficulty walking or climbing stairs?: No Does the patient have difficulty dressing or bathing?: No Does the patient have difficulty doing errands alone such as visiting a doctor's office or shopping?: No  MMSE - Mini Mental State Exam 11/09/2018  Orientation to time 5  Orientation to Place 5  Registration 3  Attention/ Calculation 5  Recall 3  Language- name 2 objects 2  Language- repeat 1  Language- follow 3 step command 3  Language- read & follow direction 1  Write a sentence 1  Copy design 1  Total score 30    Fall Risk  11/09/2018 04/23/2018 02/05/2018 11/06/2017  Falls in the past  year? 0 No No No     Assessment/Plan: 1. Encounter for general adult medical examination with abnormal findings Annual health maintenance exam. Routine, fasting labs ordered.  - UA/M w/rflx Culture, Routine  2. Acquired hypothyroidism Check thyroid panel. Adjust levothyroxine as indicated.   3. Pain in joint of right knee Regular visits with orthopedics as needed and as scheduled.   4. Screening for osteoporosis - DG Bone Density; Future  General Counseling: brianni gonzalo understanding of the findings of todays visit and agrees with plan of treatment. I have discussed any further diagnostic evaluation that may be needed or ordered today. We also reviewed her medications today. she has been encouraged to call the office with any questions or concerns that  should arise related to todays visit.    Counseling:  This patient was seen by Vincent GrosHeather Attie Nawabi FNP Collaboration with Dr Lyndon CodeFozia M Khan as a part of collaborative care agreement  Orders Placed This Encounter  Procedures  . DG Bone Density  . UA/M w/rflx Culture, Routine     Time spent: 5430 Minutes      Lyndon CodeFozia M Khan, MD  Internal Medicine

## 2018-11-13 LAB — MICROSCOPIC EXAMINATION

## 2018-11-13 LAB — URINE CULTURE, REFLEX

## 2018-11-13 LAB — UA/M W/RFLX CULTURE, ROUTINE
Bilirubin, UA: NEGATIVE
GLUCOSE, UA: NEGATIVE
Ketones, UA: NEGATIVE
NITRITE UA: NEGATIVE
PH UA: 5.5 (ref 5.0–7.5)
PROTEIN UA: NEGATIVE
RBC UA: NEGATIVE
Specific Gravity, UA: 1.021 (ref 1.005–1.030)
UUROB: 1 mg/dL (ref 0.2–1.0)

## 2019-01-17 ENCOUNTER — Other Ambulatory Visit: Payer: Self-pay

## 2019-01-17 DIAGNOSIS — E039 Hypothyroidism, unspecified: Secondary | ICD-10-CM

## 2019-01-17 MED ORDER — LIOTHYRONINE SODIUM 25 MCG PO TABS: 12.5000 ug | ORAL_TABLET | Freq: Every day | ORAL | 3 refills | Status: DC

## 2019-02-19 ENCOUNTER — Other Ambulatory Visit: Payer: Self-pay

## 2019-02-19 MED ORDER — LEVOTHYROXINE SODIUM 50 MCG PO TABS: 50.0000 ug | ORAL_TABLET | Freq: Every day | ORAL | 1 refills | Status: DC

## 2019-02-26 ENCOUNTER — Other Ambulatory Visit: Payer: Self-pay

## 2019-02-26 MED ORDER — MELOXICAM 7.5 MG PO TABS: ORAL_TABLET | ORAL | 2 refills | Status: DC

## 2019-05-08 ENCOUNTER — Encounter: Payer: Self-pay | Admitting: Adult Health

## 2019-05-08 ENCOUNTER — Other Ambulatory Visit: Payer: Self-pay

## 2019-05-08 ENCOUNTER — Ambulatory Visit (INDEPENDENT_AMBULATORY_CARE_PROVIDER_SITE_OTHER): Payer: Medicare Other | Admitting: Adult Health

## 2019-05-08 VITALS — BP 120/62 | HR 68 | Resp 16 | Ht 69.0 in | Wt 203.0 lb

## 2019-05-08 DIAGNOSIS — R0609 Other forms of dyspnea: Secondary | ICD-10-CM

## 2019-05-08 DIAGNOSIS — E039 Hypothyroidism, unspecified: Secondary | ICD-10-CM | POA: Diagnosis not present

## 2019-05-08 DIAGNOSIS — R600 Localized edema: Secondary | ICD-10-CM | POA: Diagnosis not present

## 2019-05-08 NOTE — Progress Notes (Signed)
[facility] [address] [place], [place] [address]  Internal MEDICINE  Office Visit Note  Patient Name: Karen EdwardsBarbara Hess  [id number]/01/2035  [id number]  Date of Service: 05/08/2019  Chief Complaint  Patient presents with  . Hypothyroidism    HPI  Pt is here for follow up on Arthritis, Hypothyroidism.  Pt reports overall she has been doing well.  She reports having arthritis flare ups in her knees, when she eats the wrong things.  She reports this mostly happens when she consumes bread or milk products.  She is currently taking synthroid and cytomel for her thyroid.    Current Medication: Outpatient Encounter Medications as of 05/08/2019  Medication Sig  . levothyroxine (SYNTHROID) 50 MCG tablet Take 1 tablet (50 mcg total) by mouth daily before breakfast.  . liothyronine (CYTOMEL) 25 MCG tablet Take 0.5 tablets (12.5 mcg total) by mouth daily.  . meloxicam (MOBIC) 7.5 MG tablet TAKE 1 TAB PO TWICE A DAY  . [DISCONTINUED] doxycycline (VIBRA-TABS) 100 MG tablet Take 1 tablet (100 mg total) by mouth 2 (two) times daily. (Patient not taking: Reported on 05/08/2019)  . [DISCONTINUED] mupirocin ointment (BACTROBAN) 2 % Apply to affected area BID for 10 days (Patient not taking: Reported on 05/08/2019)  . [DISCONTINUED] terbinafine (LAMISIL) 1 % cream Apply 1 application topically 2 (two) times daily. (Patient not taking: Reported on 05/08/2019)   No facility-administered encounter medications on file as of 05/08/2019.     Surgical History: Past Surgical History:  Procedure Laterality Date  . BREAST BIOPSY    . NO PAST SURGERIES      Medical History: Past Medical History:  Diagnosis Date  . Hypothyroid     Family History: Family History  Problem Relation Age of Onset  . Arthritis Mother     Social History   Socioeconomic History  . Marital status: Single    Spouse name: Not on file  . Number of children: Not on file  . Years of education: Not on file  . Highest education  level: Not on file  Occupational History  . Not on file  Social Needs  . Financial resource strain: Not on file  . Food insecurity    Worry: Not on file    Inability: Not on file  . Transportation needs    Medical: Not on file    Non-medical: Not on file  Tobacco Use  . Smoking status: Former Games developermoker  . Smokeless tobacco: Never Used  Substance and Sexual Activity  . Alcohol use: No    Alcohol/week: 0.0 standard drinks  . Drug use: No  . Sexual activity: Not on file  Lifestyle  . Physical activity    Days per week: Not on file    Minutes per session: Not on file  . Stress: Not on file  Relationships  . Social Musicianconnections    Talks on phone: Not on file    Gets together: Not on file    Attends religious service: Not on file    Active member of club or organization: Not on file    Attends meetings of clubs or organizations: Not on file    Relationship status: Not on file  . Intimate partner violence    Fear of current or ex partner: Not on file    Emotionally abused: Not on file    Physically abused: Not on file    Forced sexual activity: Not on file  Other Topics Concern  . Not on file  Social History  Narrative  . Not on file      Review of Systems  Constitutional: Negative for chills, fatigue and unexpected weight change.  HENT: Negative for congestion, rhinorrhea, sneezing and sore throat.   Eyes: Negative for photophobia, pain and redness.  Respiratory: Negative for cough, chest tightness and shortness of breath.   Cardiovascular: Negative for chest pain and palpitations.  Gastrointestinal: Negative for abdominal pain, constipation, diarrhea, nausea and vomiting.  Endocrine: Negative.   Genitourinary: Negative for dysuria and frequency.  Musculoskeletal: Negative for arthralgias, back pain, joint swelling and neck pain.  Skin: Negative for rash.  Allergic/Immunologic: Negative.   Neurological: Negative for tremors and numbness.  Hematological: Negative for  adenopathy. Does not bruise/bleed easily.  Psychiatric/Behavioral: Negative for behavioral problems and sleep disturbance. The patient is not nervous/anxious.     Vital Signs: BP 120/62   Pulse 68   Resp 16   Ht 5\' 9"  (1.753 m)   Wt 203 lb (92.1 kg)   SpO2 94%   BMI 29.98 kg/m    Physical Exam Vitals signs and nursing note reviewed.  Constitutional:      General: She is not in acute distress.    Appearance: She is well-developed. She is not diaphoretic.  HENT:     Head: Normocephalic and atraumatic.     Mouth/Throat:     Pharynx: No oropharyngeal exudate.  Eyes:     Pupils: Pupils are equal, round, and reactive to light.  Neck:     Musculoskeletal: Normal range of motion and neck supple.     Thyroid: No thyromegaly.     Vascular: No JVD.     Trachea: No tracheal deviation.  Cardiovascular:     Rate and Rhythm: Normal rate and regular rhythm.     Heart sounds: Normal heart sounds. No murmur. No friction rub. No gallop.   Pulmonary:     Effort: Pulmonary effort is normal. No respiratory distress.     Breath sounds: Normal breath sounds. No wheezing or rales.  Chest:     Chest wall: No tenderness.  Abdominal:     Palpations: Abdomen is soft.     Tenderness: There is no abdominal tenderness. There is no guarding.  Musculoskeletal: Normal range of motion.  Lymphadenopathy:     Cervical: No cervical adenopathy.  Skin:    General: Skin is warm and dry.  Neurological:     Mental Status: She is alert and oriented to person, place, and time.     Cranial Nerves: No cranial nerve deficit.  Psychiatric:        Behavior: Behavior normal.        Thought Content: Thought content normal.        Judgment: Judgment normal.    Assessment/Plan: 1. Acquired hypothyroidism Continue current medications, will recheck labs at physical.   2. Dyspnea on exertion Get echo for increased DOE, as well as edema.  - ECHOCARDIOGRAM COMPLETE; Future  3. Edema, lower extremity Some lower  extremity edema noted. Will evaluate echo when results are available.   General Counseling: myah guynes understanding of the findings of todays visit and agrees with plan of treatment. I have discussed any further diagnostic evaluation that may be needed or ordered today. We also reviewed her medications today. she has been encouraged to call the office with any questions or concerns that should arise related to todays visit.    No orders of the defined types were placed in this encounter.   No orders of the defined  types were placed in this encounter.   Time spent: 15 Minutes   This patient was seen by Blima LedgerAdam Deyci Gesell AGNP-C in Collaboration with Dr Lyndon CodeFozia M Khan as a part of collaborative care agreement     Johnna AcostaAdam J. Willye Javier AGNP-C Internal medicine

## 2019-05-09 ENCOUNTER — Other Ambulatory Visit: Payer: Self-pay | Admitting: Nurse Practitioner

## 2019-05-10 ENCOUNTER — Other Ambulatory Visit: Payer: Medicare Other

## 2019-05-10 ENCOUNTER — Ambulatory Visit: Payer: Self-pay | Admitting: Adult Health

## 2019-05-10 LAB — COMPREHENSIVE METABOLIC PANEL
ALT: 10 IU/L (ref 0–32)
AST: 13 IU/L (ref 0–40)
Albumin/Globulin Ratio: 1.7 (ref 1.2–2.2)
Albumin: 4.3 g/dL (ref 3.6–4.6)
Alkaline Phosphatase: 71 IU/L (ref 39–117)
BUN/Creatinine Ratio: 16 (ref 12–28)
BUN: 18 mg/dL (ref 8–27)
Bilirubin Total: 0.3 mg/dL (ref 0.0–1.2)
CO2: 22 mmol/L (ref 20–29)
Calcium: 9.4 mg/dL (ref 8.7–10.3)
Chloride: 104 mmol/L (ref 96–106)
Creatinine, Ser: 1.15 mg/dL — ABNORMAL HIGH (ref 0.57–1.00)
GFR calc Af Amer: 51 mL/min/{1.73_m2} — ABNORMAL LOW (ref >59)
GFR calc non Af Amer: 44 mL/min/{1.73_m2} — ABNORMAL LOW (ref >59)
Globulin, Total: 2.5 g/dL (ref 1.5–4.5)
Glucose: 108 mg/dL — ABNORMAL HIGH (ref 65–99)
Potassium: 3.9 mmol/L (ref 3.5–5.2)
Sodium: 141 mmol/L (ref 134–144)
Total Protein: 6.8 g/dL (ref 6.0–8.5)

## 2019-05-10 LAB — LIPID PANEL WITH LDL/HDL RATIO
Cholesterol, Total: 181 mg/dL (ref 100–199)
HDL: 57 mg/dL (ref >39)
LDL Calculated: 111 mg/dL — ABNORMAL HIGH (ref 0–99)
LDl/HDL Ratio: 1.9 ratio (ref 0.0–3.2)
Triglycerides: 65 mg/dL (ref 0–149)
VLDL Cholesterol Cal: 13 mg/dL (ref 5–40)

## 2019-05-10 LAB — T4, FREE: Free T4: 0.96 ng/dL (ref 0.82–1.77)

## 2019-05-10 LAB — CBC WITH DIFFERENTIAL/PLATELET
Basophils Absolute: 0 10*3/uL (ref 0.0–0.2)
Basos: 0 %
EOS (ABSOLUTE): 0.1 10*3/uL (ref 0.0–0.4)
Eos: 1 %
Hematocrit: 36.6 % (ref 34.0–46.6)
Hemoglobin: 12.1 g/dL (ref 11.1–15.9)
Immature Grans (Abs): 0 10*3/uL (ref 0.0–0.1)
Immature Granulocytes: 1 %
Lymphocytes Absolute: 1.3 10*3/uL (ref 0.7–3.1)
Lymphs: 19 %
MCH: 31.8 pg (ref 26.6–33.0)
MCHC: 33.1 g/dL (ref 31.5–35.7)
MCV: 96 fL (ref 79–97)
Monocytes Absolute: 0.5 10*3/uL (ref 0.1–0.9)
Monocytes: 7 %
Neutrophils Absolute: 5.1 10*3/uL (ref 1.4–7.0)
Neutrophils: 72 %
Platelets: 213 10*3/uL (ref 150–450)
RBC: 3.8 x10E6/uL (ref 3.77–5.28)
RDW: 11.6 % — ABNORMAL LOW (ref 11.7–15.4)
WBC: 7.1 10*3/uL (ref 3.4–10.8)

## 2019-05-10 LAB — TSH: TSH: 1.24 u[IU]/mL (ref 0.450–4.500)

## 2019-05-30 ENCOUNTER — Ambulatory Visit: Payer: Medicare Other | Admitting: Adult Health

## 2019-05-30 VITALS — BP 102/78 | HR 80 | Resp 16 | Ht 68.0 in | Wt 207.6 lb

## 2019-05-30 DIAGNOSIS — E039 Hypothyroidism, unspecified: Secondary | ICD-10-CM

## 2019-05-30 DIAGNOSIS — R7989 Other specified abnormal findings of blood chemistry: Secondary | ICD-10-CM | POA: Diagnosis not present

## 2019-05-30 NOTE — Progress Notes (Signed)
[facility] Karen, Hess [address]  Internal MEDICINE  Office Visit Note  Patient Name: Karen Hess  [id number]  [id number]  Date of Service: 05/30/2019  Chief Complaint  Patient presents with  . Medical Management of Chronic Issues    3 week follow up review labs     HPI  PT is here for follow up on lab results. Her creatnine is slightly elevated at 1.15. She denies any current issues and reports overall she is doing well.  Denies Chest pain, Shortness of breath, palpitations, headache, or blurred vision.      Current Medication: Outpatient Encounter Medications as of 05/30/2019  Medication Sig  . levothyroxine (SYNTHROID) 50 MCG tablet Take 1 tablet (50 mcg total) by mouth daily before breakfast.  . liothyronine (CYTOMEL) 25 MCG tablet Take 0.5 tablets (12.5 mcg total) by mouth daily.  . meloxicam (MOBIC) 7.5 MG tablet TAKE 1 TAB PO TWICE A DAY   No facility-administered encounter medications on file as of 05/30/2019.     Surgical History: Past Surgical History:  Procedure Laterality Date  . BREAST BIOPSY    . NO PAST SURGERIES      Medical History: Past Medical History:  Diagnosis Date  . Hypothyroid     Family History: Family History  Problem Relation Age of Onset  . Arthritis Mother     Social History   Socioeconomic History  . Marital status: Single    Spouse name: Not on file  . Number of children: Not on file  . Years of education: Not on file  . Highest education level: Not on file  Occupational History  . Not on file  Social Needs  . Financial resource strain: Not on file  . Food insecurity    Worry: Not on file    Inability: Not on file  . Transportation needs    Medical: Not on file    Non-medical: Not on file  Tobacco Use  . Smoking status: Former Research scientist (life sciences)  . Smokeless tobacco: Never Used  Substance and Sexual Activity  . Alcohol use: No    Alcohol/week: 0.0 standard drinks  . Drug use: No  . Sexual  activity: Not on file  Lifestyle  . Physical activity    Days per week: Not on file    Minutes per session: Not on file  . Stress: Not on file  Relationships  . Social Herbalist on phone: Not on file    Gets together: Not on file    Attends religious service: Not on file    Active member of club or organization: Not on file    Attends meetings of clubs or organizations: Not on file    Relationship status: Not on file  . Intimate partner violence    Fear of current or ex partner: Not on file    Emotionally abused: Not on file    Physically abused: Not on file    Forced sexual activity: Not on file  Other Topics Concern  . Not on file  Social History Narrative  . Not on file      Review of Systems  Constitutional: Negative for chills, fatigue and unexpected weight change.  HENT: Negative for congestion, rhinorrhea, sneezing and sore throat.   Eyes: Negative for photophobia, pain and redness.  Respiratory: Negative for cough, chest tightness and shortness of breath.   Cardiovascular: Negative for chest pain and palpitations.  Gastrointestinal: Negative for abdominal pain, constipation, diarrhea, nausea  and vomiting.  Endocrine: Negative.   Genitourinary: Negative for dysuria and frequency.  Musculoskeletal: Negative for arthralgias, back pain, joint swelling and neck pain.  Skin: Negative for rash.  Allergic/Immunologic: Negative.   Neurological: Negative for tremors and numbness.  Hematological: Negative for adenopathy. Does not bruise/bleed easily.  Psychiatric/Behavioral: Negative for behavioral problems and sleep disturbance. The patient is not nervous/anxious.     Vital Signs: BP 102/78   Pulse 80   Resp 16   Ht 5\' 8"  (1.727 m)   Wt 207 lb 9.6 oz (94.2 kg)   SpO2 97%   BMI 31.57 kg/m    Physical Exam Vitals signs and nursing note reviewed.  Constitutional:      General: She is not in acute distress.    Appearance: She is well-developed. She is  not diaphoretic.  HENT:     Head: Normocephalic and atraumatic.     Mouth/Throat:     Pharynx: No oropharyngeal exudate.  Eyes:     Pupils: Pupils are equal, round, and reactive to light.  Neck:     Musculoskeletal: Normal range of motion and neck supple.     Thyroid: No thyromegaly.     Vascular: No JVD.     Trachea: No tracheal deviation.  Cardiovascular:     Rate and Rhythm: Normal rate and regular rhythm.     Heart sounds: Normal heart sounds. No murmur. No friction rub. No gallop.   Pulmonary:     Effort: Pulmonary effort is normal. No respiratory distress.     Breath sounds: Normal breath sounds. No wheezing or rales.  Chest:     Chest wall: No tenderness.  Abdominal:     Palpations: Abdomen is soft.     Tenderness: There is no abdominal tenderness. There is no guarding.  Musculoskeletal: Normal range of motion.  Lymphadenopathy:     Cervical: No cervical adenopathy.  Skin:    General: Skin is warm and dry.  Neurological:     Mental Status: She is alert and oriented to person, place, and time.     Cranial Nerves: No cranial nerve deficit.  Psychiatric:        Behavior: Behavior normal.        Thought Content: Thought content normal.        Judgment: Judgment normal.     Assessment/Plan: 1. Elevated serum creatinine Will recheck renal function before next follow up.  - Renal Function Panel  2. Acquired hypothyroidism Stable, continue present management.  General Counseling: Karen LeavensBarbara verbalizes understanding of the findings of todays visit and agrees with plan of treatment. I have discussed any further diagnostic evaluation that may be needed or ordered today. We also reviewed her medications today. she has been encouraged to call the office with any questions or concerns that should arise related to todays visit.    No orders of the defined types were placed in this encounter.   No orders of the defined types were placed in this encounter.   Time spent: 15  Minutes   This patient was seen by Blima LedgerAdam Jaleah Lefevre AGNP-C in Collaboration with Dr Lyndon CodeFozia M Khan as a part of collaborative care agreement     Johnna AcostaAdam J. Jonavin Seder AGNP-C Internal medicine

## 2019-09-04 ENCOUNTER — Other Ambulatory Visit: Payer: Self-pay | Admitting: Adult Health

## 2019-09-05 ENCOUNTER — Telehealth: Payer: Self-pay

## 2019-09-05 LAB — KIDNEY PROFILE
Creatinine, Ser: 1.13 mg/dL — ABNORMAL HIGH (ref 0.57–1.00)
Creatinine, Urine: 43.9 mg/dL
GFR calc Af Amer: 52 mL/min/{1.73_m2} — ABNORMAL LOW (ref >59)
GFR calc non Af Amer: 45 mL/min/{1.73_m2} — ABNORMAL LOW (ref >59)
Microalb/Creat Ratio: <7 mg/g creat (ref 0–29)
Microalbumin, Urine: <3 ug/mL

## 2019-09-05 NOTE — Telephone Encounter (Signed)
Called confirmed appointment with patient. klh 

## 2019-09-09 ENCOUNTER — Encounter: Payer: Self-pay | Admitting: Adult Health

## 2019-09-09 ENCOUNTER — Other Ambulatory Visit: Payer: Self-pay

## 2019-09-09 ENCOUNTER — Ambulatory Visit (INDEPENDENT_AMBULATORY_CARE_PROVIDER_SITE_OTHER): Payer: Medicare Other | Admitting: Adult Health

## 2019-09-09 VITALS — BP 138/76 | HR 64 | Temp 97.4°F | Resp 16 | Ht 68.0 in | Wt 199.0 lb

## 2019-09-09 DIAGNOSIS — R7989 Other specified abnormal findings of blood chemistry: Secondary | ICD-10-CM

## 2019-09-09 DIAGNOSIS — Z13820 Encounter for screening for osteoporosis: Secondary | ICD-10-CM

## 2019-09-09 NOTE — Progress Notes (Signed)
[facility] [address] [place], [place] [address]  Internal MEDICINE  Office Visit Note  Patient Name: Karen EdwardsBarbara Hess  191478Sep 30, 2036  [id number]  Date of Service: 09/15/2019  Chief Complaint  Patient presents with  . Medical Management of Chronic Issues    3 month follow up review labs   . Hypothyroidism    HPI Patient is here today to follow-up on kidney function as labs were repeated due to elevated creatinine of 1.15 in August of this year. Creatinine at this time has improved slightly to 1.13, patient reports increasing her water intake. Reports today that she is having increased pain in bilateral legs. Pain has been ongoing for years as she has been followed by ortho and has received cortisone injections every 3 months for the past 2 years. Patient has been made aware that she is need of knee replacements but has avoided surgery with cortisone injections. Has been hesitant to continue with injections as her last injection did not last more than a few weeks at providing pain relief, she is also aware that long-term use of cortisone can damage her bones and place her at an increased risk of osteoporosis and fractures. Reports still being able to ambulate without assistance, reports she has not been taking Meloxicam in several months, ruling out this possibly contributing to her increased kidney function, denies taking any other OTC NSAIDS. Denies chest pain, shortness of breath or palpitations.    Current Medication: Outpatient Encounter Medications as of 09/09/2019  Medication Sig  . levothyroxine (SYNTHROID) 50 MCG tablet Take 1 tablet (50 mcg total) by mouth daily before breakfast.  . liothyronine (CYTOMEL) 25 MCG tablet Take 0.5 tablets (12.5 mcg total) by mouth daily.  . meloxicam (MOBIC) 7.5 MG tablet TAKE 1 TAB PO TWICE A DAY   No facility-administered encounter medications on file as of 09/09/2019.    Surgical History: Past Surgical History:  Procedure  Laterality Date  . BREAST BIOPSY    . NO PAST SURGERIES      Medical History: Past Medical History:  Diagnosis Date  . Hypothyroid     Family History: Family History  Problem Relation Age of Onset  . Arthritis Mother     Social History   Socioeconomic History  . Marital status: Single    Spouse name: Not on file  . Number of children: Not on file  . Years of education: Not on file  . Highest education level: Not on file  Occupational History  . Not on file  Tobacco Use  . Smoking status: Former Games developermoker  . Smokeless tobacco: Never Used  Substance and Sexual Activity  . Alcohol use: No    Alcohol/week: 0.0 standard drinks  . Drug use: No  . Sexual activity: Not on file  Other Topics Concern  . Not on file  Social History Narrative  . Not on file   Social Determinants of Health   Financial Resource Strain:   . Difficulty of Paying Living Expenses: Not on file  Food Insecurity:   . Worried About Programme researcher, broadcasting/film/videounning Out of Food in the Last Year: Not on file  . Ran Out of Food in the Last Year: Not on file  Transportation Needs:   . Lack of Transportation (Medical): Not on file  . Lack of Transportation (Non-Medical): Not on file  Physical Activity:   . Days of Exercise per Week: Not on file  . Minutes of Exercise per Session: Not on file  Stress:   . Feeling  of Stress : Not on file  Social Connections:   . Frequency of Communication with Friends and Family: Not on file  . Frequency of Social Gatherings with Friends and Family: Not on file  . Attends Religious Services: Not on file  . Active Member of Clubs or Organizations: Not on file  . Attends Banker Meetings: Not on file  . Marital Status: Not on file  Intimate Partner Violence:   . Fear of Current or Ex-Partner: Not on file  . Emotionally Abused: Not on file  . Physically Abused: Not on file  . Sexually Abused: Not on file    Review of Systems  Constitutional: Negative for chills, fatigue and  unexpected weight change.  HENT: Negative for congestion, rhinorrhea, sneezing and sore throat.   Eyes: Negative for photophobia, pain and redness.  Respiratory: Negative for cough, chest tightness and shortness of breath.   Cardiovascular: Negative for chest pain and palpitations.  Gastrointestinal: Negative for abdominal pain, constipation, diarrhea, nausea and vomiting.  Endocrine: Negative.   Genitourinary: Negative for dysuria and frequency.  Musculoskeletal: Negative for arthralgias, back pain, joint swelling and neck pain.       Bilateral leg pain, extending from knee to ankle, swelling in right knee  Skin: Negative for rash.  Allergic/Immunologic: Negative.   Neurological: Negative for tremors and numbness.  Hematological: Negative for adenopathy. Does not bruise/bleed easily.  Psychiatric/Behavioral: Negative for behavioral problems and sleep disturbance. The patient is not nervous/anxious.     Vital Signs: BP 138/76   Pulse 64   Temp (!) 97.4 F (36.3 C)   Resp 16   Ht 5\' 8"  (1.727 m)   Wt 199 lb (90.3 kg)   SpO2 97%   BMI 30.26 kg/m    Physical Exam Vitals signs and nursing note reviewed.  Constitutional:      General: She is not in acute distress.    Appearance: She is well-developed. She is not diaphoretic.  HENT:     Head: Normocephalic and atraumatic.     Mouth/Throat:     Pharynx: No oropharyngeal exudate.  Eyes:     Pupils: Pupils are equal, round, and reactive to light.  Neck:     Musculoskeletal: Normal range of motion and neck supple.     Thyroid: No thyromegaly.     Vascular: No JVD.     Trachea: No tracheal deviation.  Cardiovascular:     Rate and Rhythm: Normal rate and regular rhythm.     Heart sounds: Normal heart sounds. No murmur. No friction rub. No gallop.   Pulmonary:     Effort: Pulmonary effort is normal. No respiratory distress.     Breath sounds: Normal breath sounds. No wheezing or rales.  Chest:     Chest wall: No tenderness.   Abdominal:     Palpations: Abdomen is soft.     Tenderness: There is no abdominal tenderness. There is no guarding.  Musculoskeletal: Normal range of motion.     Right knee: She exhibits swelling.     Comments: FROM to bilateral legs, right knee swollen compared to left knee. No redness or increased warmth to bilateral knee joints.  Lymphadenopathy:     Cervical: No cervical adenopathy.  Skin:    General: Skin is warm and dry.  Neurological:     Mental Status: She is alert and oriented to person, place, and time.     Cranial Nerves: No cranial nerve deficit.  Psychiatric:  Behavior: Behavior normal.        Thought Content: Thought content normal.        Judgment: Judgment normal.     Assessment/Plan: 1. Elevated serum creatinine Slightly improved creatinine today at 1.13, still elevated. Renal US to rule out cyst or other findings contributing to elevated creatinine. Encouraged patient to continue to increase water intake. - US Renal; Future  2. Screening for osteoporosis Long-term use of cortisone injections for bilateral knee pain. - DG Bone Density; Future  General Counseling: kayzlee wirtanen understanding of the findings of todays visit and agrees with plan of treatment. I have discussed any further diagnostic evaluation that may be needed or ordered today. We also reviewed her medications today. she has been encouraged to call the office with any questions or concerns that should arise related to todays visit.    Orders Placed This Encounter  Procedures  . US Renal  . DG Bone Density    No orders of the defined types were placed in this encounter.   Time spent: 25 Minutes   This patient was seen by Orson Gear AGNP-C in Collaboration with Dr Lavera Guise as a part of collaborative care agreement     Kendell Bane AGNP-C Internal medicine

## 2019-09-18 ENCOUNTER — Inpatient Hospital Stay: Admission: RE | Admit: 2019-09-18 | Payer: Medicare Other | Source: Ambulatory Visit

## 2019-09-23 ENCOUNTER — Other Ambulatory Visit: Payer: Self-pay

## 2019-09-23 DIAGNOSIS — E039 Hypothyroidism, unspecified: Secondary | ICD-10-CM

## 2019-09-23 MED ORDER — LEVOTHYROXINE SODIUM 50 MCG PO TABS: 50.0000 ug | ORAL_TABLET | Freq: Every day | ORAL | 1 refills | Status: DC

## 2019-09-23 MED ORDER — LIOTHYRONINE SODIUM 25 MCG PO TABS: 12.5000 ug | ORAL_TABLET | Freq: Every day | ORAL | 3 refills | Status: DC

## 2019-09-25 ENCOUNTER — Ambulatory Visit
Admission: RE | Admit: 2019-09-25 | Discharge: 2019-09-25 | Disposition: A | Discharge status: Home / Self Care | Payer: Medicare Other | Source: Ambulatory Visit | Attending: Adult Health | Admitting: Adult Health

## 2019-09-25 DIAGNOSIS — M85832 Other specified disorders of bone density and structure, left forearm: Secondary | ICD-10-CM | POA: Insufficient documentation

## 2019-09-25 DIAGNOSIS — Z7952 Long term (current) use of systemic steroids: Secondary | ICD-10-CM | POA: Diagnosis not present

## 2019-09-25 DIAGNOSIS — Z13820 Encounter for screening for osteoporosis: Secondary | ICD-10-CM | POA: Insufficient documentation

## 2019-09-25 NOTE — Progress Notes (Signed)
Osteopenia in forearm. Normal femur. Discuss at visit 10/15/2019

## 2019-10-09 ENCOUNTER — Telehealth: Payer: Self-pay

## 2019-10-09 NOTE — Telephone Encounter (Signed)
Done by mistake. klh 

## 2019-10-09 NOTE — Telephone Encounter (Signed)
Confirmed appointment with patient. klh °

## 2019-10-10 ENCOUNTER — Telehealth: Payer: Self-pay

## 2019-10-10 NOTE — Telephone Encounter (Signed)
Rescheduled ultrasound due to weather. klh

## 2019-10-11 ENCOUNTER — Other Ambulatory Visit: Payer: Medicare Other

## 2019-10-15 ENCOUNTER — Ambulatory Visit: Payer: Medicare Other | Admitting: Adult Health

## 2019-10-30 ENCOUNTER — Telehealth: Payer: Self-pay

## 2019-10-30 NOTE — Telephone Encounter (Signed)
Confirmed patient ultrasound 11/01/19 

## 2019-11-01 ENCOUNTER — Other Ambulatory Visit: Payer: Self-pay

## 2019-11-01 ENCOUNTER — Ambulatory Visit (INDEPENDENT_AMBULATORY_CARE_PROVIDER_SITE_OTHER): Payer: Medicare Other

## 2019-11-01 DIAGNOSIS — R7989 Other specified abnormal findings of blood chemistry: Secondary | ICD-10-CM

## 2019-11-08 ENCOUNTER — Telehealth: Payer: Self-pay

## 2019-11-08 ENCOUNTER — Ambulatory Visit: Payer: Medicare Other | Admitting: Adult Health

## 2019-11-08 NOTE — Telephone Encounter (Signed)
Called confirmed appointment on 11/12/2019 and screened for covid. klh

## 2019-11-12 ENCOUNTER — Encounter: Payer: Self-pay | Admitting: Adult Health

## 2019-11-12 ENCOUNTER — Ambulatory Visit (INDEPENDENT_AMBULATORY_CARE_PROVIDER_SITE_OTHER): Payer: Medicare Other | Admitting: Adult Health

## 2019-11-12 ENCOUNTER — Other Ambulatory Visit: Payer: Self-pay

## 2019-11-12 VITALS — BP 135/57 | HR 64 | Temp 97.4°F | Resp 16 | Ht 68.0 in | Wt 199.6 lb

## 2019-11-12 DIAGNOSIS — R7989 Other specified abnormal findings of blood chemistry: Secondary | ICD-10-CM | POA: Diagnosis not present

## 2019-11-12 DIAGNOSIS — E039 Hypothyroidism, unspecified: Secondary | ICD-10-CM

## 2019-11-12 DIAGNOSIS — M79604 Pain in right leg: Secondary | ICD-10-CM | POA: Diagnosis not present

## 2019-11-12 DIAGNOSIS — R300 Dysuria: Secondary | ICD-10-CM | POA: Diagnosis not present

## 2019-11-12 DIAGNOSIS — Z0001 Encounter for general adult medical examination with abnormal findings: Secondary | ICD-10-CM

## 2019-11-12 NOTE — Progress Notes (Signed)
[facility] [place], [place] [address]  Internal MEDICINE  Office Visit Note  Patient Name: Karen Hess  [id number]  [id number]  Date of Service: 01/01/2020  Chief Complaint  Patient presents with  . Medicare Wellness  . Follow-up    REVIEW ULTRASOUND  . Foot Swelling    swelling in both feet for the past 3 months, also swelling in the right leg     HPI Pt is here for routine health maintenance examination.  She is a well appearing 85 yo AA female.  She does not have any new concerns today.  We discussed the results of her renal ultrasound.  She has bilateral cysts, and we discussed avoiding Nsaids and other nephrotoxic drugs.  Loraine Leriche stop her meloxicam at this time. She verbalized understanding.  She does complain of swelling to her right leg, mostly in the thigh area.  I do not appreciate any swelling today compared to her left leg.  She denies any pain or warmth to the swelling area.  She does have some soreness to the RLL, she reports she notices certain foods will make it hurt worse.    Current Medication: Outpatient Encounter Medications as of 11/12/2019  Medication Sig  . levothyroxine (SYNTHROID) 50 MCG tablet Take 1 tablet (50 mcg total) by mouth daily before breakfast.  . liothyronine (CYTOMEL) 25 MCG tablet Take 0.5 tablets (12.5 mcg total) by mouth daily.  . [DISCONTINUED] meloxicam (MOBIC) 7.5 MG tablet TAKE 1 TAB PO TWICE A DAY   No facility-administered encounter medications on file as of 11/12/2019.    Surgical History: Past Surgical History:  Procedure Laterality Date  . BREAST BIOPSY    . NO PAST SURGERIES      Medical History: Past Medical History:  Diagnosis Date  . Hypothyroid     Family History: Family History  Problem Relation Age of Onset  . Arthritis Mother       Review of Systems  Constitutional: Negative for chills, fatigue and unexpected weight change.  HENT: Negative for congestion, rhinorrhea, sneezing and  sore throat.   Eyes: Negative for photophobia, pain and redness.  Respiratory: Negative for cough, chest tightness and shortness of breath.   Cardiovascular: Negative for chest pain and palpitations.  Gastrointestinal: Negative for abdominal pain, constipation, diarrhea, nausea and vomiting.  Endocrine: Negative.   Genitourinary: Negative for dysuria and frequency.  Musculoskeletal: Negative for arthralgias, back pain, joint swelling and neck pain.  Skin: Negative for rash.  Allergic/Immunologic: Negative.   Neurological: Negative for tremors and numbness.  Hematological: Negative for adenopathy. Does not bruise/bleed easily.  Psychiatric/Behavioral: Negative for behavioral problems and sleep disturbance. The patient is not nervous/anxious.      Vital Signs: BP (!) 135/57   Pulse 64   Temp (!) 97.4 F (36.3 C)   Resp 16   Ht 5\' 8"  (1.727 m)   Wt 199 lb 9.6 oz (90.5 kg)   SpO2 97%   BMI 30.35 kg/m    Physical Exam Vitals and nursing note reviewed.  Constitutional:      General: She is not in acute distress.    Appearance: She is well-developed. She is not diaphoretic.  HENT:     Head: Normocephalic and atraumatic.     Mouth/Throat:     Pharynx: No oropharyngeal exudate.  Eyes:     Pupils: Pupils are equal, round, and reactive to light.  Neck:     Thyroid: No thyromegaly.     Vascular: No JVD.  Trachea: No tracheal deviation.  Cardiovascular:     Rate and Rhythm: Normal rate and regular rhythm.     Heart sounds: Normal heart sounds. No murmur. No friction rub. No gallop.   Pulmonary:     Effort: Pulmonary effort is normal. No respiratory distress.     Breath sounds: Normal breath sounds. No wheezing or rales.  Chest:     Chest wall: No tenderness.  Abdominal:     Palpations: Abdomen is soft.     Tenderness: There is no abdominal tenderness. There is no guarding.  Musculoskeletal:        General: Normal range of motion.     Cervical back: Normal range of  motion and neck supple.  Lymphadenopathy:     Cervical: No cervical adenopathy.  Skin:    General: Skin is warm and dry.  Neurological:     Mental Status: She is alert and oriented to person, place, and time.     Cranial Nerves: No cranial nerve deficit.  Psychiatric:        Behavior: Behavior normal.        Thought Content: Thought content normal.        Judgment: Judgment normal.      LABS: Recent Results (from the past 2160 hour(s))  UA/M w/rflx Culture, Routine     Status: Abnormal   Collection Time: 11/12/19 12:09 PM   Specimen: Urine   URINE  Result Value Ref Range   Specific Gravity, UA 1.011 1.005 - 1.030   pH, UA 6.5 5.0 - 7.5   Color, UA Yellow Yellow   Appearance Ur Cloudy (A) Clear   Leukocytes,UA Trace (A) Negative   Protein,UA Negative Negative/Trace   Glucose, UA Negative Negative   Ketones, UA Negative Negative   RBC, UA Negative Negative   Bilirubin, UA Negative Negative   Urobilinogen, Ur 0.2 0.2 - 1.0 mg/dL   Nitrite, UA Positive (A) Negative   Microscopic Examination See below:     Comment: Microscopic was indicated and was performed.   Urinalysis Reflex Comment     Comment: This specimen has reflexed to a Urine Culture.  Microscopic Examination     Status: Abnormal   Collection Time: 11/12/19 12:09 PM   URINE  Result Value Ref Range   WBC, UA 0-5 0 - 5 /hpf   RBC 0-2 0 - 2 /hpf   Epithelial Cells (non renal) None seen 0 - 10 /hpf   Casts None seen None seen /lpf   Crystals Present (A) N/A   Crystal Type Amorphous Sediment N/A   Mucus, UA Present Not Estab.   Bacteria, UA Moderate (A) None seen/Few  Urine Culture, Reflex     Status: Abnormal   Collection Time: 11/12/19 12:09 PM   URINE  Result Value Ref Range   Urine Culture, Routine Final report (A)    Organism ID, Bacteria Citrobacter freundii (A)     Comment: Greater than 100,000 colony forming units per mL   Antimicrobial Susceptibility Comment     Comment:       ** S = Susceptible; I  = Intermediate; R = Resistant **                    P = Positive; N = Negative             MICS are expressed in micrograms per mL    Antibiotic  RSLT#1    RSLT#2    RSLT#3    RSLT#4 Amoxicillin/Clavulanic Acid    R Cefepime                       S Ceftriaxone                    S Cefuroxime                     R Ciprofloxacin                  S Ertapenem                      S Gentamicin                     S Imipenem                       S Levofloxacin                   S Meropenem                      S Nitrofurantoin                 S Tetracycline                   S Tobramycin                     S Trimethoprim/Sulfa             S     Assessment/Plan: 1. Encounter for general adult medical examination with abnormal findings UP to date on PHM  2. Acquired hypothyroidism Continue synthroid as prescribed.   3. Elevated serum creatinine Elevated creatinine, mild. Continue to monitor.  Recheck at next visit.  4. Right leg pain - POCT ABI Screening Pilot No Charge - [place] Lower Ext Art Bilat; Future  General Counseling: ciela mahajan understanding of the findings of todays visit and agrees with plan of treatment. I have discussed any further diagnostic evaluation that may be needed or ordered today. We also reviewed her medications today. she has been encouraged to call the office with any questions or concerns that should arise related to todays visit.   Orders Placed This Encounter  Procedures  . Microscopic Examination  . Urine Culture, Reflex  . [place] Lower Ext Art Bilat  . UA/M w/rflx Culture, Routine  . POCT ABI Screening Pilot No Charge    No orders of the defined types were placed in this encounter.   Time spent: 30 Minutes   This patient was seen by Blima Ledger AGNP-C in Collaboration with Dr Lyndon Code as a part of collaborative care agreement    Johnna Acosta AGNP-C Internal Medicine

## 2019-11-12 NOTE — Progress Notes (Signed)
Discuss BMD, treat for OP

## 2019-11-16 LAB — URINE CULTURE, REFLEX

## 2019-11-16 LAB — UA/M W/RFLX CULTURE, ROUTINE
Bilirubin, UA: NEGATIVE
Glucose, UA: NEGATIVE
Ketones, UA: NEGATIVE
Nitrite, UA: POSITIVE — AB
Protein,UA: NEGATIVE
RBC, UA: NEGATIVE
Specific Gravity, UA: 1.011 (ref 1.005–1.030)
Urobilinogen, Ur: 0.2 mg/dL (ref 0.2–1.0)
pH, UA: 6.5 (ref 5.0–7.5)

## 2019-11-16 LAB — MICROSCOPIC EXAMINATION
Casts: NONE SEEN /lpf
Epithelial Cells (non renal): NONE SEEN /hpf (ref 0–10)

## 2019-11-19 ENCOUNTER — Other Ambulatory Visit: Payer: Self-pay | Admitting: Adult Health

## 2019-11-19 MED ORDER — NITROFURANTOIN MONOHYD MACRO 100 MG PO CAPS: 100.0000 mg | ORAL_CAPSULE | Freq: Two times a day (BID) | ORAL | 0 refills | Status: AC

## 2019-11-19 NOTE — Progress Notes (Signed)
macrobid RX sent to pharmacy

## 2019-11-20 ENCOUNTER — Telehealth: Payer: Self-pay

## 2019-11-20 NOTE — Telephone Encounter (Signed)
Pt advised urine showed infection we send antibiotic to phar  

## 2019-11-20 NOTE — Telephone Encounter (Signed)
-----   Message from Johnna Acosta, NP sent at 11/19/2019  6:38 PM EST ----- Sent Macrobid to patients pharmacy. Urine shows bacteria.

## 2019-11-20 NOTE — Telephone Encounter (Signed)
Confirmed US appointment on 11/22/2019 and screened for covid. klh 

## 2019-11-22 ENCOUNTER — Ambulatory Visit (INDEPENDENT_AMBULATORY_CARE_PROVIDER_SITE_OTHER): Payer: Medicare Other

## 2019-11-22 ENCOUNTER — Other Ambulatory Visit: Payer: Self-pay

## 2019-11-22 DIAGNOSIS — M79604 Pain in right leg: Secondary | ICD-10-CM | POA: Diagnosis not present

## 2019-12-06 ENCOUNTER — Telehealth: Payer: Self-pay

## 2019-12-06 NOTE — Telephone Encounter (Signed)
CONFIRMED AND SCREENED FOR 12-10-19 OV. 

## 2019-12-06 NOTE — Telephone Encounter (Signed)
Called lmom informing patient of appointment on 12/10/2019. klh 

## 2019-12-10 ENCOUNTER — Encounter: Payer: Self-pay | Admitting: Adult Health

## 2019-12-10 ENCOUNTER — Ambulatory Visit (INDEPENDENT_AMBULATORY_CARE_PROVIDER_SITE_OTHER): Payer: Medicare Other | Admitting: Adult Health

## 2019-12-10 ENCOUNTER — Other Ambulatory Visit: Payer: Self-pay

## 2019-12-10 VITALS — BP 146/51 | HR 68 | Temp 97.5°F | Resp 16 | Ht 68.0 in | Wt 204.0 lb

## 2019-12-10 DIAGNOSIS — M25562 Pain in left knee: Secondary | ICD-10-CM

## 2019-12-10 DIAGNOSIS — M8580 Other specified disorders of bone density and structure, unspecified site: Secondary | ICD-10-CM

## 2019-12-10 DIAGNOSIS — M25561 Pain in right knee: Secondary | ICD-10-CM

## 2019-12-10 DIAGNOSIS — I70213 Atherosclerosis of native arteries of extremities with intermittent claudication, bilateral legs: Secondary | ICD-10-CM | POA: Diagnosis not present

## 2019-12-10 DIAGNOSIS — G8929 Other chronic pain: Secondary | ICD-10-CM

## 2019-12-10 DIAGNOSIS — Z6830 Body mass index (BMI) 30.0-30.9, adult: Secondary | ICD-10-CM

## 2019-12-10 NOTE — Progress Notes (Signed)
[facility] [address] [place], [place] [address]  Internal MEDICINE  Office Visit Note  Patient Name: Karen Hess  [id number]  [id number]  Date of Service: 12/10/2019  Chief Complaint  Patient presents with  . Follow-up    meds for bacteria may not have helped    HPI Patient here today to follow-up on chronic medical conditions. Discussed with her the results of her vascular US of bilateral lower extremity, revealing mild atherosclerosis, negative for DVT. She continues to report pain in her bilateral legs, mainly in her knees at this point. Discussed the pain is more than likely related to osteoporosis in her knee joints. Discussed many options of OTC therapies to help with her pain. Also discussed adding calcium and vitamin D supplement to her daily regimen to treat osteopenia found on recent bone density scan. Was recently treated for UTI, reports no further urniary symptoms at this time. Denies shortness of breath, chest pain or palpitations.     Current Medication: Outpatient Encounter Medications as of 12/10/2019  Medication Sig  . levothyroxine (SYNTHROID) 50 MCG tablet Take 1 tablet (50 mcg total) by mouth daily before breakfast.  . liothyronine (CYTOMEL) 25 MCG tablet Take 0.5 tablets (12.5 mcg total) by mouth daily.  . [EXPIRED] nitrofurantoin, macrocrystal-monohydrate, (MACROBID) 100 MG capsule Take 1 capsule (100 mg total) by mouth 2 (two) times daily for 10 days.   No facility-administered encounter medications on file as of 12/10/2019.    Surgical History: Past Surgical History:  Procedure Laterality Date  . BREAST BIOPSY    . NO PAST SURGERIES      Medical History: Past Medical History:  Diagnosis Date  . Hypothyroid     Family History: Family History  Problem Relation Age of Onset  . Arthritis Mother     Social History   Socioeconomic History  . Marital status: Single    Spouse name: Not on file  . Number of children: Not on file  .  Years of education: Not on file  . Highest education level: Not on file  Occupational History  . Not on file  Tobacco Use  . Smoking status: Former Games developer  . Smokeless tobacco: Never Used  Substance and Sexual Activity  . Alcohol use: No    Alcohol/week: 0.0 standard drinks  . Drug use: No  . Sexual activity: Not on file  Other Topics Concern  . Not on file  Social History Narrative  . Not on file   Social Determinants of Health   Financial Resource Strain:   . Difficulty of Paying Living Expenses: Not on file  Food Insecurity:   . Worried About Programme researcher, broadcasting/film/video in the Last Year: Not on file  . Ran Out of Food in the Last Year: Not on file  Transportation Needs:   . Lack of Transportation (Medical): Not on file  . Lack of Transportation (Non-Medical): Not on file  Physical Activity:   . Days of Exercise per Week: Not on file  . Minutes of Exercise per Session: Not on file  Stress:   . Feeling of Stress : Not on file  Social Connections:   . Frequency of Communication with Friends and Family: Not on file  . Frequency of Social Gatherings with Friends and Family: Not on file  . Attends Religious Services: Not on file  . Active Member of Clubs or Organizations: Not on file  . Attends Banker Meetings: Not on file  . Marital Status: Not  on file  Intimate Partner Violence:   . Fear of Current or Ex-Partner: Not on file  . Emotionally Abused: Not on file  . Physically Abused: Not on file  . Sexually Abused: Not on file      Review of Systems  Constitutional: Negative for chills, fatigue and unexpected weight change.  HENT: Negative for congestion, rhinorrhea, sneezing and sore throat.   Eyes: Negative for photophobia, pain and redness.  Respiratory: Negative for cough, chest tightness and shortness of breath.   Cardiovascular: Negative for chest pain and palpitations.  Gastrointestinal: Negative for abdominal pain, constipation, diarrhea, nausea and  vomiting.  Endocrine: Negative.   Genitourinary: Negative for dysuria and frequency.  Musculoskeletal: Positive for arthralgias. Negative for back pain, joint swelling and neck pain.       Bilateral knee pain  Skin: Negative for rash.  Allergic/Immunologic: Negative.   Neurological: Negative for tremors and numbness.  Hematological: Negative for adenopathy. Does not bruise/bleed easily.  Psychiatric/Behavioral: Negative for behavioral problems and sleep disturbance. The patient is not nervous/anxious.     Vital Signs: BP (!) 146/51   Pulse 68   Temp (!) 97.5 F (36.4 C)   Resp 16   Ht 5\' 8"  (1.727 m)   Wt 204 lb (92.5 kg)   SpO2 97%   BMI 31.02 kg/m    Physical Exam Vitals and nursing note reviewed.  Constitutional:      General: She is not in acute distress.    Appearance: She is well-developed. She is not diaphoretic.  HENT:     Head: Normocephalic and atraumatic.     Mouth/Throat:     Pharynx: No oropharyngeal exudate.  Eyes:     Pupils: Pupils are equal, round, and reactive to light.  Neck:     Thyroid: No thyromegaly.     Vascular: No JVD.     Trachea: No tracheal deviation.  Cardiovascular:     Rate and Rhythm: Normal rate and regular rhythm.     Heart sounds: Normal heart sounds. No murmur. No friction rub. No gallop.   Pulmonary:     Effort: Pulmonary effort is normal. No respiratory distress.     Breath sounds: Normal breath sounds. No wheezing or rales.  Chest:     Chest wall: No tenderness.  Abdominal:     Palpations: Abdomen is soft.     Tenderness: There is no abdominal tenderness. There is no guarding.  Musculoskeletal:        General: Normal range of motion.     Cervical back: Normal range of motion and neck supple.  Lymphadenopathy:     Cervical: No cervical adenopathy.  Skin:    General: Skin is warm and dry.  Neurological:     Mental Status: She is alert and oriented to person, place, and time.     Cranial Nerves: No cranial nerve deficit.   Psychiatric:        Behavior: Behavior normal.        Thought Content: Thought content normal.        Judgment: Judgment normal.     Assessment/Plan: 1. Chronic pain of both knees Discussed that this pain is related to arthritic changes to knee joint. Discussed several options to try OTC for this pain. Encouraged to avoid nephrotoxic agents such as ibuprofen. Will monitor response to these therapies and manage her symptoms.  2. Osteopenia, unspecified location Encouraged to add calcium and vitamin D supplementation to her daily regimen to treat osteopenia. Will continue  to monitor.  3. Atheroscler of native artery of both legs with intermit claudication (HCC) Evidence of atherosclerosis found on vascular US of bilateral lower extremities on 11/22/19. Encouraged to follow a healthy diet and exercise.  4. BMI 30.0-30.9,adult Obesity Counseling: Risk Assessment: An assessment of behavioral risk factors was made today and includes lack of exercise sedentary lifestyle, lack of portion control and poor dietary habits.  Risk Modification Advice: She was counseled on portion control guidelines. Restricting daily caloric intake to 1800. The detrimental long term effects of obesity on her health and ongoing poor compliance was also discussed with the patient.    General Counseling: sonyia muro understanding of the findings of todays visit and agrees with plan of treatment. I have discussed any further diagnostic evaluation that may be needed or ordered today. We also reviewed her medications today. she has been encouraged to call the office with any questions or concerns that should arise related to todays visit.    No orders of the defined types were placed in this encounter.   No orders of the defined types were placed in this encounter.   Time spent: 30 Minutes   This patient was seen by Blima Ledger AGNP-C in Collaboration with Dr Lyndon Code as a part of collaborative care  agreement     Johnna Acosta AGNP-C Internal medicine

## 2020-03-11 ENCOUNTER — Encounter: Payer: Self-pay | Admitting: Adult Health

## 2020-03-11 ENCOUNTER — Ambulatory Visit (INDEPENDENT_AMBULATORY_CARE_PROVIDER_SITE_OTHER): Payer: Medicare Other | Admitting: Adult Health

## 2020-03-11 ENCOUNTER — Other Ambulatory Visit: Payer: Self-pay

## 2020-03-11 VITALS — BP 128/60 | HR 69 | Temp 97.2°F | Resp 16 | Ht 68.0 in | Wt 194.0 lb

## 2020-03-11 DIAGNOSIS — W57XXXA Bitten or stung by nonvenomous insect and other nonvenomous arthropods, initial encounter: Secondary | ICD-10-CM

## 2020-03-11 DIAGNOSIS — M25562 Pain in left knee: Secondary | ICD-10-CM

## 2020-03-11 DIAGNOSIS — E039 Hypothyroidism, unspecified: Secondary | ICD-10-CM

## 2020-03-11 DIAGNOSIS — I70213 Atherosclerosis of native arteries of extremities with intermittent claudication, bilateral legs: Secondary | ICD-10-CM

## 2020-03-11 DIAGNOSIS — M25561 Pain in right knee: Secondary | ICD-10-CM | POA: Diagnosis not present

## 2020-03-11 DIAGNOSIS — M8580 Other specified disorders of bone density and structure, unspecified site: Secondary | ICD-10-CM | POA: Diagnosis not present

## 2020-03-11 DIAGNOSIS — G8929 Other chronic pain: Secondary | ICD-10-CM

## 2020-03-11 DIAGNOSIS — Z6829 Body mass index (BMI) 29.0-29.9, adult: Secondary | ICD-10-CM

## 2020-03-11 MED ORDER — DOXYCYCLINE MONOHYDRATE 100 MG PO CAPS: 100.0000 mg | ORAL_CAPSULE | Freq: Two times a day (BID) | ORAL | 0 refills | Status: DC

## 2020-03-11 NOTE — Progress Notes (Signed)
[facility] [address] [place], [place] [address]  Internal MEDICINE  Office Visit Note  Patient Name: Karen Hess  [id number]  [id number]  Date of Service: 03/11/2020  Chief Complaint  Patient presents with  . Hypothyroidism    HPI  Pt is here for follow up on chronic knee pain, osteopenia, and hypothyroid.  She report she is at her baseline.  Her knees continue to hurt but she is taking some supplements to try and help.  She denies any new or concerning symptoms.  She does report she thinks she had a tick on the inside of her thigh, that she would like looked at.    Current Medication: Outpatient Encounter Medications as of 03/11/2020  Medication Sig  . levothyroxine (SYNTHROID) 50 MCG tablet Take 1 tablet (50 mcg total) by mouth daily before breakfast.  . liothyronine (CYTOMEL) 25 MCG tablet Take 0.5 tablets (12.5 mcg total) by mouth daily.  Marland Kitchen doxycycline (MONODOX) 100 MG capsule Take 1 capsule (100 mg total) by mouth 2 (two) times daily.   No facility-administered encounter medications on file as of 03/11/2020.    Surgical History: Past Surgical History:  Procedure Laterality Date  . BREAST BIOPSY    . NO PAST SURGERIES      Medical History: Past Medical History:  Diagnosis Date  . Hypothyroid     Family History: Family History  Problem Relation Age of Onset  . Arthritis Mother     Social History   Socioeconomic History  . Marital status: Single    Spouse name: Not on file  . Number of children: Not on file  . Years of education: Not on file  . Highest education level: Not on file  Occupational History  . Not on file  Tobacco Use  . Smoking status: Former Games developer  . Smokeless tobacco: Never Used  Substance and Sexual Activity  . Alcohol use: No    Alcohol/week: 0.0 standard drinks  . Drug use: No  . Sexual activity: Not on file  Other Topics Concern  . Not on file  Social History Narrative  . Not on file   Social Determinants of  Health   Financial Resource Strain:   . Difficulty of Paying Living Expenses:   Food Insecurity:   . Worried About Programme researcher, broadcasting/film/video in the Last Year:   . Barista in the Last Year:   Transportation Needs:   . Freight forwarder (Medical):   Marland Kitchen Lack of Transportation (Non-Medical):   Physical Activity:   . Days of Exercise per Week:   . Minutes of Exercise per Session:   Stress:   . Feeling of Stress :   Social Connections:   . Frequency of Communication with Friends and Family:   . Frequency of Social Gatherings with Friends and Family:   . Attends Religious Services:   . Active Member of Clubs or Organizations:   . Attends Banker Meetings:   Marland Kitchen Marital Status:   Intimate Partner Violence:   . Fear of Current or Ex-Partner:   . Emotionally Abused:   Marland Kitchen Physically Abused:   . Sexually Abused:       Review of Systems  Constitutional: Negative for chills, fatigue and unexpected weight change.  HENT: Negative for congestion, rhinorrhea, sneezing and sore throat.   Eyes: Negative for photophobia, pain and redness.  Respiratory: Negative for cough, chest tightness and shortness of breath.   Cardiovascular: Negative for chest pain and palpitations.  Gastrointestinal: Negative for abdominal pain, constipation, diarrhea, nausea and vomiting.  Endocrine: Negative.   Genitourinary: Negative for dysuria and frequency.  Musculoskeletal: Negative for arthralgias, back pain, joint swelling and neck pain.  Skin: Negative for rash.  Allergic/Immunologic: Negative.   Neurological: Negative for tremors and numbness.  Hematological: Negative for adenopathy. Does not bruise/bleed easily.  Psychiatric/Behavioral: Negative for behavioral problems and sleep disturbance. The patient is not nervous/anxious.     Vital Signs: BP 128/60   Pulse 69   Temp (!) 97.2 F (36.2 C)   Resp 16   Ht 5\' 8"  (1.727 m)   Wt 194 lb (88 kg)   SpO2 98%   BMI 29.50 kg/m     Physical Exam Vitals and nursing note reviewed.  Constitutional:      General: She is not in acute distress.    Appearance: She is well-developed. She is not diaphoretic.  HENT:     Head: Normocephalic and atraumatic.     Mouth/Throat:     Pharynx: No oropharyngeal exudate.  Eyes:     Pupils: Pupils are equal, round, and reactive to light.  Neck:     Thyroid: No thyromegaly.     Vascular: No JVD.     Trachea: No tracheal deviation.  Cardiovascular:     Rate and Rhythm: Normal rate and regular rhythm.     Heart sounds: Normal heart sounds. No murmur. No friction rub. No gallop.   Pulmonary:     Effort: Pulmonary effort is normal. No respiratory distress.     Breath sounds: Normal breath sounds. No wheezing or rales.  Chest:     Chest wall: No tenderness.  Abdominal:     Palpations: Abdomen is soft.     Tenderness: There is no abdominal tenderness. There is no guarding.  Musculoskeletal:        General: Normal range of motion.     Cervical back: Normal range of motion and neck supple.  Lymphadenopathy:     Cervical: No cervical adenopathy.  Skin:    General: Skin is warm and dry.  Neurological:     Mental Status: She is alert and oriented to person, place, and time.     Cranial Nerves: No cranial nerve deficit.  Psychiatric:        Behavior: Behavior normal.        Thought Content: Thought content normal.        Judgment: Judgment normal.     Assessment/Plan: 1. Chronic pain of both knees Continue current management.  2. Osteopenia, unspecified location Continue current management.  3. Atheroscler of native artery of both legs with intermit claudication ([place]) Per vascular  4. BMI 29.0-29.9,adult Obesity Counseling: Risk Assessment: An assessment of behavioral risk factors was made today and includes lack of exercise sedentary lifestyle, lack of portion control and poor dietary habits.  Risk Modification Advice: She was counseled on portion control  guidelines. Restricting daily caloric intake to 1800. The detrimental long term effects of obesity on her health and ongoing poor compliance was also discussed with the patient.  5. Acquired hypothyroidism Continue synthroid as before.  6. Tick bite, initial encounter Take monodox as discussed.  - Lyme Ab (IgG/M) + RMSF( IgG/M) - doxycycline (MONODOX) 100 MG capsule; Take 1 capsule (100 mg total) by mouth 2 (two) times daily.  Dispense: 28 capsule; Refill: 0  General Counseling: Verdella verbalizes understanding of the findings of todays visit and agrees with plan of treatment. I have discussed any further diagnostic  evaluation that may be needed or ordered today. We also reviewed her medications today. she has been encouraged to call the office with any questions or concerns that should arise related to todays visit.    Orders Placed This Encounter  Procedures  . Lyme Ab (IgG/M) + RMSF( IgG/M)    Meds ordered this encounter  Medications  . doxycycline (MONODOX) 100 MG capsule    Sig: Take 1 capsule (100 mg total) by mouth 2 (two) times daily.    Dispense:  28 capsule    Refill:  0    Time spent: 30 Minutes   This patient was seen by Blima Ledger AGNP-C in Collaboration with Dr Lyndon Code as a part of collaborative care agreement     Johnna Acosta AGNP-C Internal medicine

## 2020-03-17 ENCOUNTER — Telehealth: Payer: Self-pay

## 2020-03-17 LAB — LYMEAB(IGG/M)+RMSF(IGG/M)
LYME DISEASE AB, QUANT, IGM: <0.8 index (ref 0.00–0.79)
Lyme IgG/IgM Ab: <0.91 {ISR} (ref 0.00–0.90)
RMSF IgG: POSITIVE — AB
RMSF IgM: 0.29 index (ref 0.00–0.89)

## 2020-03-17 LAB — RMSF, IGG, IFA: RMSF, IGG, IFA: <1:64 {titer}

## 2020-03-17 NOTE — Telephone Encounter (Signed)
As per adam pt advised labs came back normal

## 2020-03-25 ENCOUNTER — Other Ambulatory Visit: Payer: Self-pay | Admitting: Adult Health

## 2020-03-25 DIAGNOSIS — E039 Hypothyroidism, unspecified: Secondary | ICD-10-CM

## 2020-05-04 ENCOUNTER — Telehealth: Payer: Self-pay

## 2020-05-04 NOTE — Telephone Encounter (Signed)
Confirmed appointment on 05/06/2020 and screened for covid. klh °

## 2020-05-06 ENCOUNTER — Ambulatory Visit (INDEPENDENT_AMBULATORY_CARE_PROVIDER_SITE_OTHER): Payer: Medicare Other | Admitting: Adult Health

## 2020-05-06 ENCOUNTER — Encounter: Payer: Self-pay | Admitting: Adult Health

## 2020-05-06 ENCOUNTER — Other Ambulatory Visit: Payer: Self-pay

## 2020-05-06 VITALS — BP 120/55 | HR 71 | Temp 97.5°F | Resp 16 | Ht 68.0 in | Wt 194.0 lb

## 2020-05-06 DIAGNOSIS — G8929 Other chronic pain: Secondary | ICD-10-CM

## 2020-05-06 DIAGNOSIS — M25562 Pain in left knee: Secondary | ICD-10-CM | POA: Diagnosis not present

## 2020-05-06 DIAGNOSIS — E039 Hypothyroidism, unspecified: Secondary | ICD-10-CM | POA: Diagnosis not present

## 2020-05-06 DIAGNOSIS — M8580 Other specified disorders of bone density and structure, unspecified site: Secondary | ICD-10-CM | POA: Diagnosis not present

## 2020-05-06 DIAGNOSIS — M25561 Pain in right knee: Secondary | ICD-10-CM | POA: Diagnosis not present

## 2020-05-06 NOTE — Progress Notes (Signed)
[id number] [facility] [address] [place], [place] [address]  Internal MEDICINE  Office Visit Note  Patient Name: Karen Hess  [id number]  [id number]  Date of Service: 05/06/2020  Chief Complaint  Patient presents with  . Follow-up  . Quality Metric Gaps    tdap    HPI  Pt is here for follow up.  She has a history of chronic knee pain, osteopenia, and hypothyroid.  She reports overall she is at her normal state of health.  She denies any new or worsening complaints. She continues to do well with current medications.     Current Medication: Outpatient Encounter Medications as of 05/06/2020  Medication Sig  . levothyroxine (SYNTHROID) 50 MCG tablet TAKE 1 TABLET BY MOUTH DAILY BEFORE BREAKFAST  . liothyronine (CYTOMEL) 25 MCG tablet TAKE 0.5 TABLETS (12.5 MCG TOTAL) BY MOUTH DAILY.  . [DISCONTINUED] doxycycline (MONODOX) 100 MG capsule Take 1 capsule (100 mg total) by mouth 2 (two) times daily.   No facility-administered encounter medications on file as of 05/06/2020.    Surgical History: Past Surgical History:  Procedure Laterality Date  . BREAST BIOPSY    . NO PAST SURGERIES      Medical History: Past Medical History:  Diagnosis Date  . Hypothyroid     Family History: Family History  Problem Relation Age of Onset  . Arthritis Mother     Social History   Socioeconomic History  . Marital status: Single    Spouse name: Not on file  . Number of children: Not on file  . Years of education: Not on file  . Highest education level: Not on file  Occupational History  . Not on file  Tobacco Use  . Smoking status: Former Games developer  . Smokeless tobacco: Never Used  Vaping Use  . Vaping Use: Never used  Substance and Sexual Activity  . Alcohol use: No    Alcohol/week: 0.0 standard drinks  . Drug use: No  . Sexual activity: Not on file  Other Topics Concern  . Not on file  Social History Narrative  . Not on file   Social Determinants of Health   Financial  Resource Strain:   . Difficulty of Paying Living Expenses:   Food Insecurity:   . Worried About Programme researcher, broadcasting/film/video in the Last Year:   . Barista in the Last Year:   Transportation Needs:   . Freight forwarder (Medical):   Marland Kitchen Lack of Transportation (Non-Medical):   Physical Activity:   . Days of Exercise per Week:   . Minutes of Exercise per Session:   Stress:   . Feeling of Stress :   Social Connections:   . Frequency of Communication with Friends and Family:   . Frequency of Social Gatherings with Friends and Family:   . Attends Religious Services:   . Active Member of Clubs or Organizations:   . Attends Banker Meetings:   Marland Kitchen Marital Status:   Intimate Partner Violence:   . Fear of Current or Ex-Partner:   . Emotionally Abused:   Marland Kitchen Physically Abused:   . Sexually Abused:       Review of Systems  Constitutional: Negative for chills, fatigue and unexpected weight change.  HENT: Negative for congestion, rhinorrhea, sneezing and sore throat.   Eyes: Negative for photophobia, pain and redness.  Respiratory: Negative for cough, chest tightness and shortness of breath.   Cardiovascular: Negative for chest pain and palpitations.  Gastrointestinal: Negative for  abdominal pain, constipation, diarrhea, nausea and vomiting.  Endocrine: Negative.   Genitourinary: Negative for dysuria and frequency.  Musculoskeletal: Negative for arthralgias, back pain, joint swelling and neck pain.  Skin: Negative for rash.  Allergic/Immunologic: Negative.   Neurological: Negative for tremors and numbness.  Hematological: Negative for adenopathy. Does not bruise/bleed easily.  Psychiatric/Behavioral: Negative for behavioral problems and sleep disturbance. The patient is not nervous/anxious.     Vital Signs: BP (!) 120/55   Pulse 71   Temp (!) 97.5 F (36.4 C)   Resp 16   Ht 5\' 8"  (1.727 m)   Wt 194 lb (88 kg)   SpO2 97%   BMI 29.50 kg/m    Physical Exam Vitals  and nursing note reviewed.  Constitutional:      General: She is not in acute distress.    Appearance: She is well-developed. She is not diaphoretic.  HENT:     Head: Normocephalic and atraumatic.     Mouth/Throat:     Pharynx: No oropharyngeal exudate.  Eyes:     Pupils: Pupils are equal, round, and reactive to light.  Neck:     Thyroid: No thyromegaly.     Vascular: No JVD.     Trachea: No tracheal deviation.  Cardiovascular:     Rate and Rhythm: Normal rate and regular rhythm.     Heart sounds: Normal heart sounds. No murmur heard.  No friction rub. No gallop.   Pulmonary:     Effort: Pulmonary effort is normal. No respiratory distress.     Breath sounds: Normal breath sounds. No wheezing or rales.  Chest:     Chest wall: No tenderness.  Abdominal:     Palpations: Abdomen is soft.     Tenderness: There is no abdominal tenderness. There is no guarding.  Musculoskeletal:        General: Normal range of motion.     Cervical back: Normal range of motion and neck supple.  Lymphadenopathy:     Cervical: No cervical adenopathy.  Skin:    General: Skin is warm and dry.  Neurological:     Mental Status: She is alert and oriented to person, place, and time.     Cranial Nerves: No cranial nerve deficit.  Psychiatric:        Behavior: Behavior normal.        Thought Content: Thought content normal.        Judgment: Judgment normal.    Assessment/Plan: 1. Acquired hypothyroidism Stable, continue to monitor TSH and FT4.  2. Chronic pain of both knees AT baseline, continue current therapy.   3. Osteopenia, unspecified location Stable, no new issues.   General Counseling: Karen Hess understanding of the findings of todays visit and agrees with plan of treatment. I have discussed any further diagnostic evaluation that may be needed or ordered today. We also reviewed her medications today. she has been encouraged to call the office with any questions or concerns that  should arise related to todays visit.    No orders of the defined types were placed in this encounter.   No orders of the defined types were placed in this encounter.   Time spent: 25 Minutes   This patient was seen by 26 AGNP-C in Collaboration with Dr Blima Ledger as a part of collaborative care agreement     Lyndon Code AGNP-C Internal medicine

## 2020-09-20 ENCOUNTER — Other Ambulatory Visit: Payer: Self-pay | Admitting: Adult Health

## 2020-10-02 ENCOUNTER — Other Ambulatory Visit: Payer: Self-pay | Admitting: Adult Health

## 2020-10-05 ENCOUNTER — Other Ambulatory Visit: Payer: Self-pay | Admitting: Adult Health

## 2020-10-08 ENCOUNTER — Other Ambulatory Visit: Payer: Self-pay | Admitting: Adult Health

## 2020-10-09 ENCOUNTER — Other Ambulatory Visit: Payer: Self-pay | Admitting: Adult Health

## 2020-11-16 ENCOUNTER — Ambulatory Visit (INDEPENDENT_AMBULATORY_CARE_PROVIDER_SITE_OTHER): Payer: Medicare Other | Admitting: Physician Assistant

## 2020-11-16 ENCOUNTER — Encounter: Payer: Self-pay | Admitting: Physician Assistant

## 2020-11-16 ENCOUNTER — Other Ambulatory Visit: Payer: Self-pay

## 2020-11-16 DIAGNOSIS — G8929 Other chronic pain: Secondary | ICD-10-CM

## 2020-11-16 DIAGNOSIS — M8580 Other specified disorders of bone density and structure, unspecified site: Secondary | ICD-10-CM | POA: Diagnosis not present

## 2020-11-16 DIAGNOSIS — E039 Hypothyroidism, unspecified: Secondary | ICD-10-CM | POA: Diagnosis not present

## 2020-11-16 DIAGNOSIS — R5383 Other fatigue: Secondary | ICD-10-CM

## 2020-11-16 DIAGNOSIS — M25562 Pain in left knee: Secondary | ICD-10-CM

## 2020-11-16 DIAGNOSIS — R030 Elevated blood-pressure reading, without diagnosis of hypertension: Secondary | ICD-10-CM

## 2020-11-16 DIAGNOSIS — Z124 Encounter for screening for malignant neoplasm of cervix: Secondary | ICD-10-CM

## 2020-11-16 DIAGNOSIS — R300 Dysuria: Secondary | ICD-10-CM

## 2020-11-16 DIAGNOSIS — Z0001 Encounter for general adult medical examination with abnormal findings: Secondary | ICD-10-CM

## 2020-11-16 DIAGNOSIS — M25561 Pain in right knee: Secondary | ICD-10-CM

## 2020-11-16 NOTE — Progress Notes (Signed)
[facility] [address] [place], [place] [address]  Internal MEDICINE  Office Visit Note  Patient Name: Karen Hess  [id number]  [id number]  Date of Service: 11/17/2020  Chief Complaint  Patient presents with  . medicare well visit    Discuss meds, pt wants handicap sign     HPI Pt is here for routine health maintenance examination. She has no complaints.  -She had BMD in Dec 2020 showing osteopenia for which she has been supplementing Calcium and vit D. Can repeat BMD in December.  -A friend gave her a 4 point cane to help her stability when she is up and moving due to her chronic knee pain.  She is asking about getting a handicap placard to limit walking distance with her knee pain. Informed her that she would need to go to the [facility] and obtain paperwork for this and bring it into the office for [place] to sign. She states she can probably manage without sign for now and just park as close as she can. She will go to [facility] if she decides to pursue handicap sign though. -BP elevated on arrival, but recheck BP: 128/62  Current Medication: Outpatient Encounter Medications as of 11/16/2020  Medication Sig  . levothyroxine (SYNTHROID) 50 MCG tablet TAKE 1 TABLET BY MOUTH DAILY BEFORE BREAKFAST  . liothyronine (CYTOMEL) 25 MCG tablet TAKE 0.5 TABLETS (12.5 MCG TOTAL) BY MOUTH DAILY.   No facility-administered encounter medications on file as of 11/16/2020.    Surgical History: Past Surgical History:  Procedure Laterality Date  . BREAST BIOPSY    . NO PAST SURGERIES      Medical History: Past Medical History:  Diagnosis Date  . Hypothyroid     Family History: Family History  Problem Relation Age of Onset  . Arthritis Mother       Review of Systems  Constitutional: Negative for chills, fatigue and unexpected weight change.  HENT: Negative for congestion, postnasal drip, rhinorrhea, sneezing and sore throat.   Eyes: Negative for redness.  Respiratory: Negative  for cough, chest tightness and shortness of breath.   Cardiovascular: Negative for chest pain and palpitations.  Gastrointestinal: Negative for abdominal pain, constipation, diarrhea, nausea and vomiting.  Genitourinary: Negative for dysuria and frequency.  Musculoskeletal: Positive for arthralgias and gait problem. Negative for back pain, joint swelling and neck pain.       Chronic bilateral knee pain, using 4 point cane to help with stability and mobility due to knee pain  Skin: Negative for rash.  Neurological: Negative for tremors and numbness.  Hematological: Negative for adenopathy. Does not bruise/bleed easily.  Psychiatric/Behavioral: Negative for behavioral problems (Depression), sleep disturbance and suicidal ideas. The patient is not nervous/anxious.      Vital Signs: BP (!) 150/64   Pulse 67   Temp 97.9 F (36.6 C)   Resp 16   Ht 5\' 8"  (1.727 m)   Wt 201 lb (91.2 kg)   SpO2 98%   BMI 30.56 kg/m    Physical Exam Constitutional:      General: She is not in acute distress.    Appearance: She is well-developed. She is obese. She is not diaphoretic.  HENT:     Head: Normocephalic and atraumatic.     Right Ear: External ear normal.     Left Ear: External ear normal.     Nose: Nose normal.     Mouth/Throat:     Pharynx: No oropharyngeal exudate.  Eyes:  General: No scleral icterus.       Right eye: No discharge.        Left eye: No discharge.     Conjunctiva/sclera: Conjunctivae normal.     Pupils: Pupils are equal, round, and reactive to light.  Neck:     Thyroid: No thyromegaly.     Vascular: No JVD.     Trachea: No tracheal deviation.  Cardiovascular:     Rate and Rhythm: Normal rate and regular rhythm.     Heart sounds: Normal heart sounds. No murmur heard. No friction rub. No gallop.   Pulmonary:     Effort: Pulmonary effort is normal. No respiratory distress.     Breath sounds: Normal breath sounds. No stridor. No wheezing or rales.  Chest:      Chest wall: No tenderness.  Breasts:     Right: Normal. No mass.     Left: Normal. No mass.    Abdominal:     General: Bowel sounds are normal. There is no distension.     Palpations: Abdomen is soft. There is no mass.     Tenderness: There is no abdominal tenderness. There is no guarding or rebound.  Musculoskeletal:        General: No tenderness or deformity. Normal range of motion.     Cervical back: Normal range of motion and neck supple.     Comments: Pain with movement in knees, non TTP  Lymphadenopathy:     Cervical: No cervical adenopathy.  Skin:    General: Skin is warm and dry.     Coloration: Skin is not pale.     Findings: No erythema or rash.  Neurological:     Mental Status: She is alert and oriented to person, place, and time.     Cranial Nerves: No cranial nerve deficit.     Motor: No abnormal muscle tone.     Coordination: Coordination normal.     Gait: Gait abnormal.     Deep Tendon Reflexes: Reflexes are normal and symmetric.     Comments: Walks with 4 point cane due to chronic pain in knees  Psychiatric:        Behavior: Behavior normal.        Thought Content: Thought content normal.        Judgment: Judgment normal.      LABS: No results found for this or any previous visit (from the past 2160 hour(s)).      Assessment/Plan: 1. Encounter for general adult medical examination with abnormal findings Pt had BMD in 2020 showing osteopenia. Will order labs for review. Initially interested in obtaining handicap placard, however thinks she can manage without it. She will go to [facility] to obtain paperwork if she feels it is necessary.  2. Acquired hypothyroidism Continue thyroid medication as prescribed. Will check labs.  3. Osteopenia, unspecified location Continue calcium and vit D supplements.  4. Chronic pain of both knees Stable. Utilizing 4 point cane for stability and help with mobility.  5. Elevated BP without diagnosis of hypertension BP  initially elevated however improved to 128/62 on manual recheck. Will continue to monitor.  6. Other fatigue Order appropriate fasting labs. - CBC with Differential/Platelet; Future - Lipid Panel With LDL/HDL Ratio; Future - TSH; Future - T4, free; Future - Comprehensive metabolic panel  7. Dysuria - UA/M w/rflx Culture, Routine  General Counseling: Shamere verbalizes understanding of the findings of todays visit and agrees with plan of treatment. I have discussed any further  diagnostic evaluation that may be needed or ordered today. We also reviewed her medications today. she has been encouraged to call the office with any questions or concerns that should arise related to todays visit.    Counseling:    Orders Placed This Encounter  Procedures  . CBC with Differential/Platelet  . Lipid Panel With LDL/HDL Ratio  . TSH  . T4, free  . Comprehensive metabolic panel  . UA/M w/rflx Culture, Routine    No orders of the defined types were placed in this encounter.   Total time spent:30 Minutes  Time spent includes review of chart, medications, test results, and follow up plan with the patient.     Lyndon Code, MD  Internal Medicine

## 2020-11-24 LAB — COMPREHENSIVE METABOLIC PANEL
ALT: 11 IU/L (ref 0–32)
AST: 14 IU/L (ref 0–40)
Albumin/Globulin Ratio: 1.5 (ref 1.2–2.2)
Albumin: 4.1 g/dL (ref 3.6–4.6)
Alkaline Phosphatase: 95 IU/L (ref 44–121)
BUN/Creatinine Ratio: 14 (ref 12–28)
BUN: 15 mg/dL (ref 8–27)
Bilirubin Total: 0.4 mg/dL (ref 0.0–1.2)
CO2: 21 mmol/L (ref 20–29)
Calcium: 9.3 mg/dL (ref 8.7–10.3)
Chloride: 104 mmol/L (ref 96–106)
Creatinine, Ser: 1.07 mg/dL — ABNORMAL HIGH (ref 0.57–1.00)
GFR calc Af Amer: 55 mL/min/{1.73_m2} — ABNORMAL LOW (ref >59)
GFR calc non Af Amer: 48 mL/min/{1.73_m2} — ABNORMAL LOW (ref >59)
Globulin, Total: 2.8 g/dL (ref 1.5–4.5)
Glucose: 93 mg/dL (ref 65–99)
Potassium: 3.9 mmol/L (ref 3.5–5.2)
Sodium: 143 mmol/L (ref 134–144)
Total Protein: 6.9 g/dL (ref 6.0–8.5)

## 2020-11-26 LAB — MICROSCOPIC EXAMINATION
Casts: NONE SEEN /lpf
Casts: NONE SEEN /lpf
WBC, UA: >30 /hpf — AB (ref 0–5)
WBC, UA: >30 /hpf — AB (ref 0–5)

## 2020-11-26 LAB — UA/M W/RFLX CULTURE, ROUTINE
Bilirubin, UA: NEGATIVE
Bilirubin, UA: NEGATIVE
Glucose, UA: NEGATIVE
Glucose, UA: NEGATIVE
Ketones, UA: NEGATIVE
Ketones, UA: NEGATIVE
Nitrite, UA: POSITIVE — AB
Nitrite, UA: POSITIVE — AB
Protein,UA: NEGATIVE
Protein,UA: NEGATIVE
RBC, UA: NEGATIVE
RBC, UA: NEGATIVE
Specific Gravity, UA: 1.013 (ref 1.005–1.030)
Specific Gravity, UA: 1.013 (ref 1.005–1.030)
Urobilinogen, Ur: 0.2 mg/dL (ref 0.2–1.0)
Urobilinogen, Ur: 0.2 mg/dL (ref 0.2–1.0)
pH, UA: 7.5 (ref 5.0–7.5)
pH, UA: 7.5 (ref 5.0–7.5)

## 2020-11-26 LAB — URINE CULTURE, REFLEX

## 2020-11-27 ENCOUNTER — Other Ambulatory Visit: Payer: Self-pay | Admitting: Physician Assistant

## 2020-11-27 ENCOUNTER — Telehealth: Payer: Self-pay

## 2020-11-27 DIAGNOSIS — N3000 Acute cystitis without hematuria: Secondary | ICD-10-CM

## 2020-11-27 MED ORDER — NITROFURANTOIN MONOHYD MACRO 100 MG PO CAPS: ORAL_CAPSULE | ORAL | 0 refills | Status: DC

## 2020-11-27 NOTE — Telephone Encounter (Signed)
Spoke to pt, informed her that lab results show some bacteria in her urine, and Lauren sent in meds to the pharmacy

## 2020-11-27 NOTE — Telephone Encounter (Signed)
-----   Message from Carlean Jews, PA-C sent at 11/27/2020  1:36 PM EST ----- Can you please call the pt and let her know that she has bacteria in her urine and I am sending in a prescription for her to take.

## 2020-11-27 NOTE — Progress Notes (Signed)
Can you please call the pt and let her know that she has bacteria in her urine and I am sending in a prescription for her to take.

## 2020-12-01 ENCOUNTER — Other Ambulatory Visit: Payer: Self-pay

## 2020-12-01 DIAGNOSIS — E039 Hypothyroidism, unspecified: Secondary | ICD-10-CM

## 2020-12-01 MED ORDER — LIOTHYRONINE SODIUM 25 MCG PO TABS: 12.5000 ug | ORAL_TABLET | Freq: Every day | ORAL | 2 refills | Status: DC

## 2020-12-04 ENCOUNTER — Other Ambulatory Visit: Payer: Self-pay | Admitting: Physician Assistant

## 2020-12-04 DIAGNOSIS — N3000 Acute cystitis without hematuria: Secondary | ICD-10-CM

## 2020-12-04 NOTE — Telephone Encounter (Signed)
Pt requested refill and I informed pharmacist that pt has to be seen for refills on abx

## 2020-12-04 NOTE — Telephone Encounter (Signed)
Pt should be requesting a refill for abx. Can you please check

## 2021-02-15 ENCOUNTER — Ambulatory Visit (INDEPENDENT_AMBULATORY_CARE_PROVIDER_SITE_OTHER): Payer: Medicare Other | Admitting: Physician Assistant

## 2021-02-15 ENCOUNTER — Encounter: Payer: Self-pay | Admitting: Physician Assistant

## 2021-02-15 DIAGNOSIS — E039 Hypothyroidism, unspecified: Secondary | ICD-10-CM

## 2021-02-15 DIAGNOSIS — M25561 Pain in right knee: Secondary | ICD-10-CM | POA: Diagnosis not present

## 2021-02-15 DIAGNOSIS — M25562 Pain in left knee: Secondary | ICD-10-CM

## 2021-02-15 DIAGNOSIS — M8580 Other specified disorders of bone density and structure, unspecified site: Secondary | ICD-10-CM | POA: Diagnosis not present

## 2021-02-15 DIAGNOSIS — R600 Localized edema: Secondary | ICD-10-CM | POA: Diagnosis not present

## 2021-02-15 DIAGNOSIS — R5383 Other fatigue: Secondary | ICD-10-CM

## 2021-02-15 DIAGNOSIS — G8929 Other chronic pain: Secondary | ICD-10-CM

## 2021-02-15 NOTE — Progress Notes (Signed)
[facility] [address] [place], [place] [address]  Internal MEDICINE  Office Visit Note  Patient Name: Karen Hess  [id number]  [id number]  Date of Service: 02/15/2021  Chief Complaint  Patient presents with  . Follow-up  . Hypothyroidism    HPI Patient is here for routine follow-up and has no complaints today. Patient went for lab work however only CMP was drawn, patient will go for the remaining lab work prior to next visit -Taking vit D supplement, and thyroid medication.  May need to repeat BMD in December -Patient is currently walking with a four-point cane that was gifted to her to help with stability and given chronic knee pain -Patient does have some swelling in bilateral ankles, reports she has compression stockings at home but does not usually wear them.  Discussed elevating legs and utilizing compression stockings when standing for long periods.  Patient has not had an echo recently and will order this now  Current Medication: Outpatient Encounter Medications as of 02/15/2021  Medication Sig  . levothyroxine (SYNTHROID) 50 MCG tablet TAKE 1 TABLET BY MOUTH DAILY BEFORE BREAKFAST  . liothyronine (CYTOMEL) 25 MCG tablet Take 0.5 tablets (12.5 mcg total) by mouth daily.  . [DISCONTINUED] nitrofurantoin, macrocrystal-monohydrate, (MACROBID) 100 MG capsule Take 1 tab twice per day for 10 days.   No facility-administered encounter medications on file as of 02/15/2021.    Surgical History: Past Surgical History:  Procedure Laterality Date  . BREAST BIOPSY    . NO PAST SURGERIES      Medical History: Past Medical History:  Diagnosis Date  . Hypothyroid     Family History: Family History  Problem Relation Age of Onset  . Arthritis Mother     Social History   Socioeconomic History  . Marital status: Single    Spouse name: Not on file  . Number of children: Not on file  . Years of education: Not on file  . Highest education level: Not on file   Occupational History  . Not on file  Tobacco Use  . Smoking status: Former Games developer  . Smokeless tobacco: Never Used  Vaping Use  . Vaping Use: Never used  Substance and Sexual Activity  . Alcohol use: No    Alcohol/week: 0.0 standard drinks  . Drug use: No  . Sexual activity: Not on file  Other Topics Concern  . Not on file  Social History Narrative  . Not on file   Social Determinants of Health   Financial Resource Strain: Not on file  Food Insecurity: Not on file  Transportation Needs: Not on file  Physical Activity: Not on file  Stress: Not on file  Social Connections: Not on file  Intimate Partner Violence: Not on file      Review of Systems  Constitutional: Negative for chills, fatigue and unexpected weight change.  HENT: Negative for congestion, postnasal drip, rhinorrhea, sneezing and sore throat.   Eyes: Negative for redness.  Respiratory: Negative for cough, chest tightness and shortness of breath.   Cardiovascular: Positive for leg swelling. Negative for chest pain and palpitations.  Gastrointestinal: Negative for abdominal pain, constipation, diarrhea, nausea and vomiting.  Genitourinary: Negative for dysuria and frequency.  Musculoskeletal: Positive for arthralgias and gait problem. Negative for back pain, joint swelling and neck pain.  Skin: Negative for rash.  Neurological: Negative for tremors and numbness.  Hematological: Negative for adenopathy. Does not bruise/bleed easily.  Psychiatric/Behavioral: Negative for behavioral problems (Depression), sleep disturbance and suicidal ideas. The  patient is not nervous/anxious.     Vital Signs: BP (!) 121/53   Pulse 68   Temp 97.8 F (36.6 C)   Resp 16   Ht 5\' 8"  (1.727 m)   Wt 197 lb (89.4 kg)   SpO2 98%   BMI 29.95 kg/m    Physical Exam Vitals and nursing note reviewed.  Constitutional:      General: She is not in acute distress.    Appearance: She is well-developed. She is not diaphoretic.   HENT:     Head: Normocephalic and atraumatic.     Mouth/Throat:     Pharynx: No oropharyngeal exudate.  Eyes:     Pupils: Pupils are equal, round, and reactive to light.  Neck:     Thyroid: No thyromegaly.     Vascular: No JVD.     Trachea: No tracheal deviation.  Cardiovascular:     Rate and Rhythm: Normal rate and regular rhythm.     Pulses: Normal pulses.     Heart sounds: Normal heart sounds. No murmur heard. No friction rub. No gallop.   Pulmonary:     Effort: Pulmonary effort is normal. No respiratory distress.     Breath sounds: No wheezing or rales.  Chest:     Chest wall: No tenderness.  Abdominal:     General: Bowel sounds are normal.     Palpations: Abdomen is soft.  Musculoskeletal:        General: Normal range of motion.     Cervical back: Normal range of motion and neck supple.     Right lower leg: Edema present.     Left lower leg: Edema present.  Lymphadenopathy:     Cervical: No cervical adenopathy.  Skin:    General: Skin is warm and dry.  Neurological:     Mental Status: She is alert and oriented to person, place, and time.     Cranial Nerves: No cranial nerve deficit.     Gait: Gait abnormal.     Comments: Walks with 4 point cane  Psychiatric:        Behavior: Behavior normal.        Thought Content: Thought content normal.        Judgment: Judgment normal.        Assessment/Plan: 1. Edema, lower extremity Swelling comes and goes, will elevated legs when sitting and utilize compression stockings when walking/standing for long periods. Will order echo for further evaluation - ECHOCARDIOGRAM COMPLETE  2. Acquired hypothyroidism Will update labs and adjust therapy as indicated  3. Osteopenia, unspecified location Continue Vitamin D supplement, and encouraged to keep calcium levels up as well. Will consider repeat BMD in December  4. Chronic pain of both knees Stable, walking with 4 point cane for stability  5. Other fatigue - TSH + free  T4 - Lipid Panel With LDL/HDL Ratio - CBC w/Diff/Platelet   General Counseling: Karen Hess verbalizes understanding of the findings of todays visit and agrees with plan of treatment. I have discussed any further diagnostic evaluation that may be needed or ordered today. We also reviewed her medications today. she has been encouraged to call the office with any questions or concerns that should arise related to todays visit.    Orders Placed This Encounter  Procedures  . TSH + free T4  . Lipid Panel With LDL/HDL Ratio  . CBC w/Diff/Platelet  . ECHOCARDIOGRAM COMPLETE    No orders of the defined types were placed in this encounter.  This patient was seen by Drema Dallas, PA-C in collaboration with Dr. Clayborn Bigness as a part of collaborative care agreement.   Total time spent:30 Minutes Time spent includes review of chart, medications, test results, and follow up plan with the patient.      Dr Lavera Guise Internal medicine

## 2021-03-11 LAB — LIPID PANEL WITH LDL/HDL RATIO
Cholesterol, Total: 165 mg/dL (ref 100–199)
HDL: 60 mg/dL (ref >39)
LDL Chol Calc (NIH): 95 mg/dL (ref 0–99)
LDL/HDL Ratio: 1.6 ratio (ref 0.0–3.2)
Triglycerides: 45 mg/dL (ref 0–149)
VLDL Cholesterol Cal: 10 mg/dL (ref 5–40)

## 2021-03-11 LAB — CBC WITH DIFFERENTIAL/PLATELET
Basophils Absolute: 0 10*3/uL (ref 0.0–0.2)
Basos: 0 %
EOS (ABSOLUTE): 0.1 10*3/uL (ref 0.0–0.4)
Eos: 2 %
Hematocrit: 35.1 % (ref 34.0–46.6)
Hemoglobin: 11.9 g/dL (ref 11.1–15.9)
Immature Grans (Abs): 0 10*3/uL (ref 0.0–0.1)
Immature Granulocytes: 0 %
Lymphocytes Absolute: 1.4 10*3/uL (ref 0.7–3.1)
Lymphs: 23 %
MCH: 32.4 pg (ref 26.6–33.0)
MCHC: 33.9 g/dL (ref 31.5–35.7)
MCV: 96 fL (ref 79–97)
Monocytes Absolute: 0.5 10*3/uL (ref 0.1–0.9)
Monocytes: 9 %
Neutrophils Absolute: 4 10*3/uL (ref 1.4–7.0)
Neutrophils: 66 %
Platelets: 195 10*3/uL (ref 150–450)
RBC: 3.67 x10E6/uL — ABNORMAL LOW (ref 3.77–5.28)
RDW: 11.7 % (ref 11.7–15.4)
WBC: 6 10*3/uL (ref 3.4–10.8)

## 2021-03-11 LAB — TSH+FREE T4
Free T4: 1.04 ng/dL (ref 0.82–1.77)
TSH: 0.67 u[IU]/mL (ref 0.450–4.500)

## 2021-04-02 ENCOUNTER — Other Ambulatory Visit: Payer: Self-pay | Admitting: Physician Assistant

## 2021-04-02 DIAGNOSIS — R600 Localized edema: Secondary | ICD-10-CM

## 2021-05-05 ENCOUNTER — Other Ambulatory Visit: Payer: Medicare Other

## 2021-05-05 ENCOUNTER — Telehealth: Payer: Self-pay

## 2021-05-05 NOTE — Telephone Encounter (Signed)
Left vm to r/s ultrasound-Toni

## 2021-05-12 ENCOUNTER — Other Ambulatory Visit: Payer: Medicare Other

## 2021-05-17 ENCOUNTER — Ambulatory Visit: Payer: Medicare Other | Admitting: Physician Assistant

## 2021-05-20 ENCOUNTER — Ambulatory Visit: Payer: Medicare Other | Admitting: Physician Assistant

## 2021-05-29 ENCOUNTER — Other Ambulatory Visit: Payer: Self-pay | Admitting: Internal Medicine

## 2021-06-02 ENCOUNTER — Other Ambulatory Visit: Payer: Medicare Other

## 2021-06-09 ENCOUNTER — Ambulatory Visit: Payer: Medicare Other

## 2021-06-09 ENCOUNTER — Other Ambulatory Visit: Payer: Self-pay

## 2021-06-09 DIAGNOSIS — R600 Localized edema: Secondary | ICD-10-CM | POA: Diagnosis not present

## 2021-06-17 ENCOUNTER — Other Ambulatory Visit: Payer: Self-pay

## 2021-06-17 ENCOUNTER — Ambulatory Visit (INDEPENDENT_AMBULATORY_CARE_PROVIDER_SITE_OTHER): Payer: Medicare Other | Admitting: Physician Assistant

## 2021-06-17 ENCOUNTER — Encounter: Payer: Self-pay | Admitting: Physician Assistant

## 2021-06-17 DIAGNOSIS — I358 Other nonrheumatic aortic valve disorders: Secondary | ICD-10-CM

## 2021-06-17 DIAGNOSIS — E039 Hypothyroidism, unspecified: Secondary | ICD-10-CM | POA: Diagnosis not present

## 2021-06-17 DIAGNOSIS — M25562 Pain in left knee: Secondary | ICD-10-CM

## 2021-06-17 DIAGNOSIS — I5189 Other ill-defined heart diseases: Secondary | ICD-10-CM

## 2021-06-17 DIAGNOSIS — G8929 Other chronic pain: Secondary | ICD-10-CM

## 2021-06-17 DIAGNOSIS — M25561 Pain in right knee: Secondary | ICD-10-CM | POA: Diagnosis not present

## 2021-06-17 DIAGNOSIS — N1831 Chronic kidney disease, stage 3a: Secondary | ICD-10-CM

## 2021-06-17 NOTE — Progress Notes (Signed)
[facility] [address] [place], [place] [address]  Internal MEDICINE  Office Visit Note  Patient Name: Evamae Rowen  [id number]  [id number]  Date of Service: 06/20/2021  Chief Complaint  Patient presents with   Follow-up    Echo    Hypothyroidism    HPI Pt is here for routine follow up -Labs reviewed and overall look good -Walking with 4 point cane, knees have been the same. -Does mention some pain along left leg at times. -Echo reviewed and showed diastolic dysfunction, RA borderline dilated, and AV mild sclerosis. -Patient denies any SOB or leg swelling today. Will continue to monitor   Current Medication: Outpatient Encounter Medications as of 06/17/2021  Medication Sig   levothyroxine (SYNTHROID) 50 MCG tablet TAKE 1 TABLET BY MOUTH EVERY DAY BEFORE BREAKFAST   liothyronine (CYTOMEL) 25 MCG tablet Take 0.5 tablets (12.5 mcg total) by mouth daily.   No facility-administered encounter medications on file as of 06/17/2021.    Surgical History: Past Surgical History:  Procedure Laterality Date   BREAST BIOPSY     NO PAST SURGERIES      Medical History: Past Medical History:  Diagnosis Date   Hypothyroid     Family History: Family History  Problem Relation Age of Onset   Arthritis Mother     Social History   Socioeconomic History   Marital status: Single    Spouse name: Not on file   Number of children: Not on file   Years of education: Not on file   Highest education level: Not on file  Occupational History   Not on file  Tobacco Use   Smoking status: Former   Smokeless tobacco: Never  Vaping Use   Vaping Use: Never used  Substance and Sexual Activity   Alcohol use: No    Alcohol/week: 0.0 standard drinks   Drug use: No   Sexual activity: Not on file  Other Topics Concern   Not on file  Social History Narrative   Not on file   Social Determinants of Health   Financial Resource Strain: Not on file  Food Insecurity: Not on  file  Transportation Needs: Not on file  Physical Activity: Not on file  Stress: Not on file  Social Connections: Not on file  Intimate Partner Violence: Not on file      Review of Systems  Constitutional:  Negative for chills, fatigue and unexpected weight change.  HENT:  Negative for congestion, postnasal drip, rhinorrhea, sneezing and sore throat.   Eyes:  Negative for redness.  Respiratory:  Negative for cough, chest tightness and shortness of breath.   Cardiovascular:  Negative for chest pain and palpitations.  Gastrointestinal:  Negative for abdominal pain, constipation, diarrhea, nausea and vomiting.  Genitourinary:  Negative for dysuria and frequency.  Musculoskeletal:  Positive for arthralgias, gait problem and myalgias. Negative for back pain, joint swelling and neck pain.       Chronic bilateral knee pain, using 4 point cane to help with stability and mobility due to knee pain  Skin:  Negative for rash.  Neurological:  Negative for tremors and numbness.  Hematological:  Negative for adenopathy. Does not bruise/bleed easily.  Psychiatric/Behavioral:  Negative for behavioral problems (Depression), sleep disturbance and suicidal ideas. The patient is not nervous/anxious.    Vital Signs: BP 132/68   Pulse 80   Temp 97.8 F (36.6 C)   Resp 16   Ht 5' 8.5" (1.74 m)   Wt 188 lb (85.3 kg)  SpO2 97%   BMI 28.17 kg/m    Physical Exam Constitutional:      General: She is not in acute distress.    Appearance: She is well-developed. She is obese. She is not diaphoretic.  HENT:     Head: Normocephalic and atraumatic.     Right Ear: External ear normal.     Left Ear: External ear normal.     Nose: Nose normal.     Mouth/Throat:     Pharynx: No oropharyngeal exudate.  Eyes:     General: No scleral icterus.       Right eye: No discharge.        Left eye: No discharge.     Conjunctiva/sclera: Conjunctivae normal.     Pupils: Pupils are equal, round, and reactive to  light.  Neck:     Thyroid: No thyromegaly.     Vascular: No JVD.     Trachea: No tracheal deviation.  Cardiovascular:     Rate and Rhythm: Normal rate and regular rhythm.     Heart sounds: Normal heart sounds. No murmur heard.   No friction rub. No gallop.  Pulmonary:     Effort: Pulmonary effort is normal. No respiratory distress.     Breath sounds: Normal breath sounds. No stridor. No wheezing or rales.  Chest:     Chest wall: No tenderness.  Breasts:    Right: Normal. No mass.     Left: Normal. No mass.  Abdominal:     General: Bowel sounds are normal. There is no distension.     Palpations: Abdomen is soft. There is no mass.     Tenderness: There is no abdominal tenderness. There is no guarding or rebound.  Musculoskeletal:        General: No tenderness or deformity. Normal range of motion.     Cervical back: Normal range of motion and neck supple.     Comments: Pain with movement in knees, non TTP  Lymphadenopathy:     Cervical: No cervical adenopathy.  Skin:    General: Skin is warm and dry.     Coloration: Skin is not pale.     Findings: No erythema or rash.  Neurological:     Mental Status: She is alert and oriented to person, place, and time.     Cranial Nerves: No cranial nerve deficit.     Motor: No abnormal muscle tone.     Coordination: Coordination normal.     Gait: Gait abnormal.     Deep Tendon Reflexes: Reflexes are normal and symmetric.     Comments: Walks with 4 point cane due to chronic pain in knees  Psychiatric:        Behavior: Behavior normal.        Thought Content: Thought content normal.        Judgment: Judgment normal.       Assessment/Plan: 1. Acquired hypothyroidism Labs stable, will continue current medications  2. Mild aortic valve sclerosis Seen on echo we will continue to monitor  3. Diastolic dysfunction Seen on echo we will continue to monitor, patient denies any shortness of breath or lower extremity swelling today  4.  Chronic pain of both knees Patient can take Tylenol as needed and continue to use four-point cane for additional stability  5. Stage 3a chronic kidney disease (HCC) Will continue to monitor labs routinely.  Labs have been stable with some slight improvement on last check   General Counseling: [name] understanding of  the findings of todays visit and agrees with plan of treatment. I have discussed any further diagnostic evaluation that may be needed or ordered today. We also reviewed her medications today. she has been encouraged to call the office with any questions or concerns that should arise related to todays visit.    No orders of the defined types were placed in this encounter.   No orders of the defined types were placed in this encounter.   This patient was seen by Lynn Ito, PA-C in collaboration with Dr. Beverely Risen as a part of collaborative care agreement.   Total time spent:35 Minutes Time spent includes review of chart, medications, test results, and follow up plan with the patient.      Dr Lyndon Code Internal medicine

## 2021-07-15 ENCOUNTER — Telehealth: Payer: Self-pay

## 2021-07-15 NOTE — Telephone Encounter (Signed)
Disability parking placard signed by provider and handed back to patient. 

## 2021-08-14 ENCOUNTER — Other Ambulatory Visit: Payer: Self-pay | Admitting: Internal Medicine

## 2021-08-14 DIAGNOSIS — E039 Hypothyroidism, unspecified: Secondary | ICD-10-CM

## 2021-10-18 ENCOUNTER — Ambulatory Visit (INDEPENDENT_AMBULATORY_CARE_PROVIDER_SITE_OTHER): Payer: Medicare Other | Admitting: Physician Assistant

## 2021-10-18 ENCOUNTER — Encounter: Payer: Self-pay | Admitting: Physician Assistant

## 2021-10-18 ENCOUNTER — Other Ambulatory Visit: Payer: Self-pay

## 2021-10-18 ENCOUNTER — Telehealth: Payer: Self-pay

## 2021-10-18 VITALS — BP 133/69 | HR 69 | Temp 98.0°F | Resp 16 | Ht 68.5 in | Wt 189.4 lb

## 2021-10-18 DIAGNOSIS — E782 Mixed hyperlipidemia: Secondary | ICD-10-CM

## 2021-10-18 DIAGNOSIS — R5383 Other fatigue: Secondary | ICD-10-CM

## 2021-10-18 DIAGNOSIS — N1831 Chronic kidney disease, stage 3a: Secondary | ICD-10-CM | POA: Diagnosis not present

## 2021-10-18 DIAGNOSIS — G8929 Other chronic pain: Secondary | ICD-10-CM

## 2021-10-18 DIAGNOSIS — E039 Hypothyroidism, unspecified: Secondary | ICD-10-CM

## 2021-10-18 DIAGNOSIS — M25562 Pain in left knee: Secondary | ICD-10-CM | POA: Diagnosis not present

## 2021-10-18 MED ORDER — LIOTHYRONINE SODIUM 25 MCG PO TABS: ORAL_TABLET | ORAL | 2 refills | Status: DC

## 2021-10-18 MED ORDER — LEVOTHYROXINE SODIUM 50 MCG PO TABS: ORAL_TABLET | ORAL | 1 refills | Status: DC

## 2021-10-18 NOTE — Telephone Encounter (Signed)
Awaiting 10/18/21 office notes for ortho referral-[facility]

## 2021-10-18 NOTE — Progress Notes (Signed)
[facility] [place], [place] [address]  Internal MEDICINE  Office Visit Note  Patient Name: Karen Hess  [id number]  [id number]  Date of Service: 10/22/2021  Chief Complaint  Patient presents with   Follow-up   Hypothyroidism    HPI Pt is here for routine follow up -Left knee is only real complaint, both knees sometimes bothersome but mainly the left. Takes tylenol daily for this. Walks with 4 point cane for stability. Used to get steroid injections in knee many years ago and would be interested in restarting. May also benefit from PT -due for routine blood work prior to CPE next visit -Sleeping well -Eats well  Current Medication: Outpatient Encounter Medications as of 10/18/2021  Medication Sig   [DISCONTINUED] levothyroxine (SYNTHROID) 50 MCG tablet TAKE 1 TABLET BY MOUTH EVERY DAY BEFORE BREAKFAST   [DISCONTINUED] liothyronine (CYTOMEL) 25 MCG tablet TAKE 0.5 TABLETS BY MOUTH DAILY.   levothyroxine (SYNTHROID) 50 MCG tablet TAKE 1 TABLET BY MOUTH EVERY DAY BEFORE BREAKFAST   liothyronine (CYTOMEL) 25 MCG tablet TAKE 0.5 TABLETS BY MOUTH DAILY.   No facility-administered encounter medications on file as of 10/18/2021.    Surgical History: Past Surgical History:  Procedure Laterality Date   BREAST BIOPSY     NO PAST SURGERIES      Medical History: Past Medical History:  Diagnosis Date   Hypothyroid     Family History: Family History  Problem Relation Age of Onset   Arthritis Mother     Social History   Socioeconomic History   Marital status: Single    Spouse name: Not on file   Number of children: Not on file   Years of education: Not on file   Highest education level: Not on file  Occupational History   Not on file  Tobacco Use   Smoking status: Former   Smokeless tobacco: Never  Vaping Use   Vaping Use: Never used  Substance and Sexual Activity   Alcohol use: No    Alcohol/week: 0.0 standard drinks   Drug use: No    Sexual activity: Not on file  Other Topics Concern   Not on file  Social History Narrative   Not on file   Social Determinants of Health   Financial Resource Strain: Not on file  Food Insecurity: Not on file  Transportation Needs: Not on file  Physical Activity: Not on file  Stress: Not on file  Social Connections: Not on file  Intimate Partner Violence: Not on file      Review of Systems  Constitutional:  Negative for chills, fatigue and unexpected weight change.  HENT:  Negative for congestion, postnasal drip, rhinorrhea, sneezing and sore throat.   Eyes:  Negative for redness.  Respiratory:  Negative for cough, chest tightness and shortness of breath.   Cardiovascular:  Negative for chest pain and palpitations.  Gastrointestinal:  Negative for abdominal pain, constipation, diarrhea, nausea and vomiting.  Genitourinary:  Negative for dysuria and frequency.  Musculoskeletal:  Positive for arthralgias, gait problem and myalgias. Negative for back pain, joint swelling and neck pain.       Chronic bilateral knee pain, using 4 point cane to help with stability and mobility due to knee pain  Skin:  Negative for rash.  Neurological:  Negative for tremors and numbness.  Hematological:  Negative for adenopathy. Does not bruise/bleed easily.  Psychiatric/Behavioral:  Negative for behavioral problems (Depression), sleep disturbance and suicidal ideas. The patient is not nervous/anxious.  Vital Signs: BP 133/69    Pulse 69    Temp 98 F (36.7 C)    Resp 16    Ht 5' 8.5" (1.74 m)    Wt 189 lb 6.4 oz (85.9 kg)    SpO2 96%    BMI 28.38 kg/m    Physical Exam Constitutional:      General: She is not in acute distress.    Appearance: She is well-developed. She is obese. She is not diaphoretic.  HENT:     Head: Normocephalic and atraumatic.     Right Ear: External ear normal.     Left Ear: External ear normal.     Nose: Nose normal.     Mouth/Throat:     Pharynx: No oropharyngeal  exudate.  Eyes:     General: No scleral icterus.       Right eye: No discharge.        Left eye: No discharge.     Conjunctiva/sclera: Conjunctivae normal.     Pupils: Pupils are equal, round, and reactive to light.  Neck:     Thyroid: No thyromegaly.     Vascular: No JVD.     Trachea: No tracheal deviation.  Cardiovascular:     Rate and Rhythm: Normal rate and regular rhythm.     Heart sounds: Normal heart sounds. No murmur heard.   No friction rub. No gallop.  Pulmonary:     Effort: Pulmonary effort is normal. No respiratory distress.     Breath sounds: Normal breath sounds. No stridor. No wheezing or rales.  Chest:     Chest wall: No tenderness.  Breasts:    Right: Normal. No mass.     Left: Normal. No mass.  Abdominal:     General: Bowel sounds are normal. There is no distension.     Palpations: Abdomen is soft. There is no mass.     Tenderness: There is no abdominal tenderness. There is no guarding or rebound.  Musculoskeletal:        General: No tenderness or deformity. Normal range of motion.     Cervical back: Normal range of motion and neck supple.     Comments: Pain with movement in knees, non TTP  Lymphadenopathy:     Cervical: No cervical adenopathy.  Skin:    General: Skin is warm and dry.     Coloration: Skin is not pale.     Findings: No erythema or rash.  Neurological:     Mental Status: She is alert and oriented to person, place, and time.     Cranial Nerves: No cranial nerve deficit.     Motor: No abnormal muscle tone.     Coordination: Coordination normal.     Gait: Gait abnormal.     Deep Tendon Reflexes: Reflexes are normal and symmetric.     Comments: Walks with 4 point cane due to chronic pain in knees  Psychiatric:        Behavior: Behavior normal.        Thought Content: Thought content normal.        Judgment: Judgment normal.       Assessment/Plan: 1. Chronic pain of left knee Will refer to ortho for possible injection, may benefit  from PT as well - AMB referral to orthopedics  2. Acquired hypothyroidism Will recheck labs and update meds as indicated - levothyroxine (SYNTHROID) 50 MCG tablet; TAKE 1 TABLET BY MOUTH EVERY DAY BEFORE BREAKFAST  Dispense: 90 tablet; Refill: 1 -  liothyronine (CYTOMEL) 25 MCG tablet; TAKE 0.5 TABLETS BY MOUTH DAILY.  Dispense: 45 tablet; Refill: 2 - TSH+T4F+T3Free  3. Stage 3a chronic kidney disease (HCC) - Comprehensive metabolic panel  4. Mixed hyperlipidemia - Lipid Panel With LDL/HDL Ratio  5. Other fatigue - CBC w/Diff/Platelet   General Counseling: Francyne verbalizes understanding of the findings of todays visit and agrees with plan of treatment. I have discussed any further diagnostic evaluation that may be needed or ordered today. We also reviewed her medications today. she has been encouraged to call the office with any questions or concerns that should arise related to todays visit.    Orders Placed This Encounter  Procedures   CBC w/Diff/Platelet   Comprehensive metabolic panel   Lipid Panel With LDL/HDL Ratio   TSH+T4F+T3Free   AMB referral to orthopedics    Meds ordered this encounter  Medications   levothyroxine (SYNTHROID) 50 MCG tablet    Sig: TAKE 1 TABLET BY MOUTH EVERY DAY BEFORE BREAKFAST    Dispense:  90 tablet    Refill:  1   liothyronine (CYTOMEL) 25 MCG tablet    Sig: TAKE 0.5 TABLETS BY MOUTH DAILY.    Dispense:  45 tablet    Refill:  2    DX Code Needed  .    This patient was seen by Drema Dallas, PA-C in collaboration with Dr. Clayborn Bigness as a part of collaborative care agreement.   Total time spent:30 Minutes Time spent includes review of chart, medications, test results, and follow up plan with the patient.      Dr Lavera Guise Internal medicine

## 2021-10-22 NOTE — Telephone Encounter (Signed)
Ortho referral sent via [facility] to [facility] ?

## 2021-10-22 NOTE — Telephone Encounter (Signed)
Ortho appointment> 10/26/21 @ 9:00-Karen Hess

## 2021-12-11 ENCOUNTER — Encounter: Payer: Self-pay | Admitting: Emergency Medicine

## 2021-12-11 ENCOUNTER — Ambulatory Visit
Admission: EM | Admit: 2021-12-11 | Discharge: 2021-12-11 | Disposition: A | Discharge status: Home / Self Care | Payer: Medicare Other | Attending: Student | Admitting: Student

## 2021-12-11 ENCOUNTER — Other Ambulatory Visit: Payer: Self-pay

## 2021-12-11 DIAGNOSIS — S46812A Strain of other muscles, fascia and tendons at shoulder and upper arm level, left arm, initial encounter: Secondary | ICD-10-CM | POA: Diagnosis not present

## 2021-12-11 MED ORDER — DICLOFENAC SODIUM 1 % EX GEL: 4.0000 g | Freq: Four times a day (QID) | CUTANEOUS | 0 refills | Status: AC

## 2021-12-11 MED ORDER — PREDNISONE 20 MG PO TABS: 20.0000 mg | ORAL_TABLET | Freq: Every day | ORAL | 0 refills | Status: AC

## 2021-12-11 NOTE — ED Triage Notes (Signed)
Patient c/o left upper back pain and muscle spasm that started on Tuesday.  Patient also reports pain in her knees but have gotten better today.  Patient denies recent fall or injury.   ?

## 2021-12-11 NOTE — Discharge Instructions (Addendum)
-  Prednisone one pill with breakfast or lunch x5 days ?-Voltaren gel as needed up to 4x daily. Wash off before using heating pad ?-Follow-up with PCP if symptoms persist  ?

## 2021-12-11 NOTE — ED Provider Notes (Signed)
?[facility] ? ? ? ?CSN: [id number] ?Arrival date & time: 12/11/21  1147 ? ? ?  ? ?History   ?Chief Complaint ?Chief Complaint  ?Patient presents with  ? Back Pain  ?  Left upper  ? ? ?HPI ?Karen Hess is a 87 y.o. female presenting with left lower back pain for 4 days.  History arthritis.  Describes left upper back pain with movement for about 4 days.  Denies trauma or overuse, she is right-handed.  Denies radiation of the pain down the arm or to the jaw.  Denies chest pain, shortness of breath.  States that the left shoulder pain is only with movement, there is no pain at rest.  Has attempted Tylenol with minimal relief. ? ?HPI ? ?Past Medical History:  ?Diagnosis Date  ? Hypothyroid   ? ? ?Patient Active Problem List  ? Diagnosis Date Noted  ? Screening for osteoporosis 11/09/2018  ? Insect bite of right lower leg with infection 05/15/2018  ? Pain in joint of right knee 02/05/2018  ? Tinea manuum, pedis, and unguium 02/05/2018  ? Acquired hypothyroidism 11/15/2017  ? Urinary tract infection without hematuria 11/15/2017  ? Osteoarthritis of knee 06/12/2017  ? ? ?Past Surgical History:  ?Procedure Laterality Date  ? BREAST BIOPSY    ? NO PAST SURGERIES    ? ? ?OB History   ?No obstetric history on file. ?  ? ? ? ?Home Medications   ? ?Prior to Admission medications   ?Medication Sig Start Date End Date Taking? Authorizing Provider  ?diclofenac Sodium (VOLTAREN) 1 % GEL Apply 4 g topically 4 (four) times daily. 12/11/21  Yes Hazel Sams, PA-C  ?levothyroxine (SYNTHROID) 50 MCG tablet TAKE 1 TABLET BY MOUTH EVERY DAY BEFORE BREAKFAST 10/18/21  Yes McDonough, Lauren K, PA-C  ?liothyronine (CYTOMEL) 25 MCG tablet TAKE 0.5 TABLETS BY MOUTH DAILY. 10/18/21  Yes McDonough, Lauren K, PA-C  ?predniSONE (DELTASONE) 20 MG tablet Take 1 tablet (20 mg total) by mouth daily for 5 days. Take with breakfast or lunch. Avoid NSAIDs (ibuprofen, etc) while taking this medication. 12/11/21 12/16/21 Yes Hazel Sams, PA-C   ? ? ?Family History ?Family History  ?Problem Relation Age of Onset  ? Arthritis Mother   ? ? ?Social History ?Social History  ? ?Tobacco Use  ? Smoking status: Former  ? Smokeless tobacco: Never  ?Vaping Use  ? Vaping Use: Never used  ?Substance Use Topics  ? Alcohol use: No  ?  Alcohol/week: 0.0 standard drinks  ? Drug use: No  ? ? ? ?Allergies   ?Lactose and Wheat bran ? ? ?Review of Systems ?Review of Systems  ?Musculoskeletal:  Positive for back pain.  ?All other systems reviewed and are negative. ? ? ?Physical Exam ?Triage Vital Signs ?ED Triage Vitals  ?Enc Vitals Group  ?   BP 12/11/21 1221 127/71  ?   Pulse Rate 12/11/21 1221 64  ?   Resp 12/11/21 1221 15  ?   Temp 12/11/21 1221 98.3 ?F (36.8 ?C)  ?   Temp Source 12/11/21 1221 Oral  ?   SpO2 12/11/21 1221 99 %  ?   Weight 12/11/21 1219 189 lb 6 oz (85.9 kg)  ?   Height 12/11/21 1219 5' 8.5" (1.74 m)  ?   Head Circumference --   ?   Peak Flow --   ?   Pain Score 12/11/21 1218 0  ?   Pain Loc --   ?   Pain  Edu? --   ?   Excl. in [place]? --   ? ?No data found. ? ?Updated Vital Signs ?BP 127/71 (BP Location: Right Arm)   Pulse 64   Temp 98.3 ?F (36.8 ?C) (Oral)   Resp 15   Ht 5' 8.5" (1.74 m)   Wt 189 lb 6 oz (85.9 kg)   SpO2 99%   BMI 28.38 kg/m?  ? ?Visual Acuity ?Right Eye Distance:   ?Left Eye Distance:   ?Bilateral Distance:   ? ?Right Eye Near:   ?Left Eye Near:    ?Bilateral Near:    ? ?Physical Exam ?Vitals reviewed.  ?Constitutional:   ?   General: She is not in acute distress. ?   Appearance: Normal appearance. She is not ill-appearing.  ?HENT:  ?   Head: Normocephalic and atraumatic.  ?Pulmonary:  ?   Effort: Pulmonary effort is normal.  ?Musculoskeletal:  ?   Comments: L proximal trapezius TTP. Pain elicited with abduction L arm. No shoulder/joint tenderness.   ?Neurological:  ?   General: No focal deficit present.  ?   Mental Status: She is alert and oriented to person, place, and time.  ?Psychiatric:     ?   Mood and Affect: Mood normal.     ?    Behavior: Behavior normal.     ?   Thought Content: Thought content normal.     ?   Judgment: Judgment normal.  ? ? ? ?UC Treatments / Results  ?Labs ?(all labs ordered are listed, but only abnormal results are displayed) ?Labs Reviewed - No data to display ? ?EKG ? ? ?Radiology ?No results found. ? ?Procedures ?Procedures (including critical care time) ? ?Medications Ordered in UC ?Medications - No data to display ? ?Initial Impression / Assessment and Plan / UC Course  ?I have reviewed the triage vital signs and the nursing notes. ? ?Pertinent labs & imaging results that were available during my care of the patient were reviewed by me and considered in my medical decision making (see chart for details). ? ?  ? ?This patient is a very pleasant 87 y.o. year old female presenting with L trapezius strain. Afebrile, nontachy. Pain is reproducible and localized to the proximal L trapezius. No bony tenderness. No trauma. Conservative management with low-dose prednisone, voltaren gel. F/u with PCP if symptoms persist.  ? ?Final Clinical Impressions(s) / UC Diagnoses  ? ?Final diagnoses:  ?Strain of left trapezius muscle, initial encounter  ? ? ? ?Discharge Instructions   ? ?  ?-Prednisone one pill with breakfast or lunch x5 days ?-Voltaren gel as needed up to 4x daily. Wash off before using heating pad ?-Follow-up with PCP if symptoms persist  ? ? ?ED Prescriptions   ? ? Medication Sig Dispense Auth. Provider  ? predniSONE (DELTASONE) 20 MG tablet Take 1 tablet (20 mg total) by mouth daily for 5 days. Take with breakfast or lunch. Avoid NSAIDs (ibuprofen, etc) while taking this medication. 5 tablet Hazel Sams, PA-C  ? diclofenac Sodium (VOLTAREN) 1 % GEL Apply 4 g topically 4 (four) times daily. 100 g Hazel Sams, PA-C  ? ?  ? ?PDMP not reviewed this encounter. ?  ?Hazel Sams, PA-C ?12/11/21 1312 ? ?

## 2022-01-22 LAB — CBC WITH DIFFERENTIAL/PLATELET
Basophils Absolute: 0.1 10*3/uL (ref 0.0–0.2)
Basos: 1 %
EOS (ABSOLUTE): 0.3 10*3/uL (ref 0.0–0.4)
Eos: 3 %
Hematocrit: 35.5 % (ref 34.0–46.6)
Hemoglobin: 12.2 g/dL (ref 11.1–15.9)
Immature Grans (Abs): 0 10*3/uL (ref 0.0–0.1)
Immature Granulocytes: 1 %
Lymphocytes Absolute: 1.8 10*3/uL (ref 0.7–3.1)
Lymphs: 24 %
MCH: 33.5 pg — ABNORMAL HIGH (ref 26.6–33.0)
MCHC: 34.4 g/dL (ref 31.5–35.7)
MCV: 98 fL — ABNORMAL HIGH (ref 79–97)
Monocytes Absolute: 0.6 10*3/uL (ref 0.1–0.9)
Monocytes: 8 %
Neutrophils Absolute: 4.7 10*3/uL (ref 1.4–7.0)
Neutrophils: 63 %
Platelets: 230 10*3/uL (ref 150–450)
RBC: 3.64 x10E6/uL — ABNORMAL LOW (ref 3.77–5.28)
RDW: 11.2 % — ABNORMAL LOW (ref 11.7–15.4)
WBC: 7.5 10*3/uL (ref 3.4–10.8)

## 2022-01-22 LAB — COMPREHENSIVE METABOLIC PANEL
ALT: 12 IU/L (ref 0–32)
AST: 14 IU/L (ref 0–40)
Albumin/Globulin Ratio: 2.3 — ABNORMAL HIGH (ref 1.2–2.2)
Albumin: 4.5 g/dL (ref 3.6–4.6)
Alkaline Phosphatase: 63 IU/L (ref 44–121)
BUN/Creatinine Ratio: 21 (ref 12–28)
BUN: 19 mg/dL (ref 8–27)
Bilirubin Total: 0.3 mg/dL (ref 0.0–1.2)
CO2: 22 mmol/L (ref 20–29)
Calcium: 9.2 mg/dL (ref 8.7–10.3)
Chloride: 106 mmol/L (ref 96–106)
Creatinine, Ser: 0.89 mg/dL (ref 0.57–1.00)
Globulin, Total: 2 g/dL (ref 1.5–4.5)
Glucose: 92 mg/dL (ref 70–99)
Potassium: 4.3 mmol/L (ref 3.5–5.2)
Sodium: 143 mmol/L (ref 134–144)
Total Protein: 6.5 g/dL (ref 6.0–8.5)
eGFR: 64 mL/min/{1.73_m2} (ref >59)

## 2022-01-22 LAB — TSH+T4F+T3FREE
Free T4: 0.96 ng/dL (ref 0.82–1.77)
T3, Free: 3.3 pg/mL (ref 2.0–4.4)
TSH: 2.17 u[IU]/mL (ref 0.450–4.500)

## 2022-01-22 LAB — LIPID PANEL WITH LDL/HDL RATIO
Cholesterol, Total: 185 mg/dL (ref 100–199)
HDL: 63 mg/dL (ref >39)
LDL Chol Calc (NIH): 106 mg/dL — ABNORMAL HIGH (ref 0–99)
LDL/HDL Ratio: 1.7 ratio (ref 0.0–3.2)
Triglycerides: 87 mg/dL (ref 0–149)
VLDL Cholesterol Cal: 16 mg/dL (ref 5–40)

## 2022-01-24 ENCOUNTER — Ambulatory Visit (INDEPENDENT_AMBULATORY_CARE_PROVIDER_SITE_OTHER): Payer: Medicare Other | Admitting: Physician Assistant

## 2022-01-24 ENCOUNTER — Encounter: Payer: Self-pay | Admitting: Physician Assistant

## 2022-01-24 VITALS — BP 132/80 | HR 82 | Temp 97.6°F | Resp 16 | Ht 68.5 in | Wt 190.0 lb

## 2022-01-24 DIAGNOSIS — R300 Dysuria: Secondary | ICD-10-CM

## 2022-01-24 DIAGNOSIS — G8929 Other chronic pain: Secondary | ICD-10-CM

## 2022-01-24 DIAGNOSIS — I5189 Other ill-defined heart diseases: Secondary | ICD-10-CM | POA: Diagnosis not present

## 2022-01-24 DIAGNOSIS — Z0001 Encounter for general adult medical examination with abnormal findings: Secondary | ICD-10-CM | POA: Diagnosis not present

## 2022-01-24 DIAGNOSIS — E039 Hypothyroidism, unspecified: Secondary | ICD-10-CM | POA: Diagnosis not present

## 2022-01-24 DIAGNOSIS — M25562 Pain in left knee: Secondary | ICD-10-CM | POA: Diagnosis not present

## 2022-01-24 MED ORDER — LIOTHYRONINE SODIUM 25 MCG PO TABS: ORAL_TABLET | ORAL | 2 refills | Status: DC

## 2022-01-24 MED ORDER — PNEUMOCOCCAL 20-VAL CONJ VACC 0.5 ML IM SUSY: 0.5000 mL | PREFILLED_SYRINGE | INTRAMUSCULAR | 0 refills | Status: AC

## 2022-01-24 MED ORDER — LEVOTHYROXINE SODIUM 50 MCG PO TABS: ORAL_TABLET | ORAL | 1 refills | Status: DC

## 2022-01-24 MED ORDER — ZOSTER VAC RECOMB ADJUVANTED 50 MCG/0.5ML IM SUSR
0.5000 mL | Freq: Once | INTRAMUSCULAR | 0 refills | Status: AC
Start: 2022-01-24 — End: 2022-01-24

## 2022-01-24 MED ORDER — TETANUS-DIPHTH-ACELL PERTUSSIS 5-2.5-18.5 LF-MCG/0.5 IM SUSP: 0.5000 mL | Freq: Once | INTRAMUSCULAR | 0 refills | Status: AC
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
## 2022-01-25 LAB — UA/M W/RFLX CULTURE, ROUTINE
Bilirubin, UA: NEGATIVE
Glucose, UA: NEGATIVE
Ketones, UA: NEGATIVE
Leukocytes,UA: NEGATIVE
Nitrite, UA: NEGATIVE
Protein,UA: NEGATIVE
RBC, UA: NEGATIVE
Specific Gravity, UA: 1.014 (ref 1.005–1.030)
Urobilinogen, Ur: 0.2 mg/dL (ref 0.2–1.0)
pH, UA: 7.5 (ref 5.0–7.5)

## 2022-01-25 LAB — MICROSCOPIC EXAMINATION: Casts: NONE SEEN /lpf

## 2022-01-27 LAB — B12 AND FOLATE PANEL
Folate: 12.8 ng/mL (ref >3.0)
Vitamin B-12: 769 pg/mL (ref 232–1245)

## 2022-01-27 LAB — SPECIMEN STATUS REPORT

## 2022-06-22 ENCOUNTER — Other Ambulatory Visit: Payer: Self-pay | Admitting: Physician Assistant

## 2022-06-22 DIAGNOSIS — E039 Hypothyroidism, unspecified: Secondary | ICD-10-CM

## 2022-07-03 ENCOUNTER — Ambulatory Visit
Admission: EM | Admit: 2022-07-03 | Discharge: 2022-07-03 | Disposition: A | Discharge status: Home / Self Care | Payer: Medicare Other | Attending: Physician Assistant | Admitting: Physician Assistant

## 2022-07-03 ENCOUNTER — Encounter: Payer: Self-pay | Admitting: Emergency Medicine

## 2022-07-03 DIAGNOSIS — U071 COVID-19: Secondary | ICD-10-CM | POA: Insufficient documentation

## 2022-07-03 LAB — RESP PANEL BY RT-PCR (FLU A&B, COVID) ARPGX2
Influenza A by PCR: NEGATIVE
Influenza B by PCR: NEGATIVE
SARS Coronavirus 2 by RT PCR: POSITIVE — AB

## 2022-07-03 LAB — GROUP A STREP BY PCR: Group A Strep by PCR: NOT DETECTED

## 2022-07-03 MED ORDER — BENZONATATE 100 MG PO CAPS: 100.0000 mg | ORAL_CAPSULE | Freq: Three times a day (TID) | ORAL | 0 refills | Status: AC

## 2022-07-03 MED ORDER — NIRMATRELVIR/RITONAVIR (PAXLOVID)TABLET: 3.0000 | ORAL_TABLET | Freq: Two times a day (BID) | ORAL | 0 refills | Status: AC

## 2022-07-03 NOTE — ED Provider Notes (Signed)
[facility]    CSN: [id number] Arrival date & time: 07/03/22  1114      History   Chief Complaint Chief Complaint  Patient presents with   Cough    HPI Karen Hess is a 87 y.o. female with a history of hypothyroidism presents to the ER today with complaint of runny nose, sore throat and cough.  She reports this started last night.  She is blowing clear mucus out of her nose.  She has had some difficulty swallowing.  The cough is mostly nonproductive.  She denies headache, ear pain, shortness of breath, chest pain, nausea, vomiting or diarrhea.  She denies fever, chills or body aches.  She has not taken anything OTC for her symptoms.  She has not had sick contacts that she is aware of. HPI  Past Medical History:  Diagnosis Date   Hypothyroid     Patient Active Problem List   Diagnosis Date Noted   Screening for osteoporosis 11/09/2018   Insect bite of right lower leg with infection 05/15/2018   Pain in joint of right knee 02/05/2018   Tinea manuum, pedis, and unguium 02/05/2018   Acquired hypothyroidism 11/15/2017   Urinary tract infection without hematuria 11/15/2017   Osteoarthritis of knee 06/12/2017    Past Surgical History:  Procedure Laterality Date   BREAST BIOPSY     NO PAST SURGERIES      OB History   No obstetric history on file.      Home Medications    Prior to Admission medications   Medication Sig Start Date End Date Taking? Authorizing Provider  benzonatate (TESSALON) 100 MG capsule Take 1 capsule (100 mg total) by mouth every 8 (eight) hours. 07/03/22  Yes Jearld Fenton, NP  levothyroxine (SYNTHROID) 50 MCG tablet TAKE 1 TABLET BY MOUTH EVERY DAY BEFORE BREAKFAST 01/24/22  Yes McDonough, Lauren K, PA-C  liothyronine (CYTOMEL) 25 MCG tablet TAKE 1/2 TABLET BY MOUTH EVERY DAY 06/22/22  Yes McDonough, Lauren K, PA-C  nirmatrelvir/ritonavir EUA (PAXLOVID) 20 x 150 MG & 10 x 100MG  TABS Take 3 tablets by mouth 2 (two) times daily for 5  days. Patient GFR is > 60.  nirmatrelvir (150 mg) 2 tabs PO BID x 5 days and ritonavir (100 mg) 1 tab PO BID 5 days. 07/03/22 07/08/22 Yes Shelli Portilla, Coralie Keens, NP  diclofenac Sodium (VOLTAREN) 1 % GEL Apply 4 g topically 4 (four) times daily. 12/11/21   Hazel Sams, PA-C    Family History Family History  Problem Relation Age of Onset   Arthritis Mother     Social History Social History   Tobacco Use   Smoking status: Former   Smokeless tobacco: Never  Scientific laboratory technician Use: Never used  Substance Use Topics   Alcohol use: No    Alcohol/week: 0.0 standard drinks of alcohol   Drug use: No     Allergies   Lactose and Wheat bran   Review of Systems Review of Systems   Past Medical History:  Diagnosis Date   Hypothyroid     No current facility-administered medications for this encounter.   Current Outpatient Medications  Medication Sig Dispense Refill   benzonatate (TESSALON) 100 MG capsule Take 1 capsule (100 mg total) by mouth every 8 (eight) hours. 21 capsule 0   levothyroxine (SYNTHROID) 50 MCG tablet TAKE 1 TABLET BY MOUTH EVERY DAY BEFORE BREAKFAST 90 tablet 1   liothyronine (CYTOMEL) 25 MCG tablet TAKE 1/2 TABLET BY MOUTH EVERY  DAY 45 tablet 2   nirmatrelvir/ritonavir EUA (PAXLOVID) 20 x 150 MG & 10 x 100MG  TABS Take 3 tablets by mouth 2 (two) times daily for 5 days. Patient GFR is > 60.  nirmatrelvir (150 mg) 2 tabs PO BID x 5 days and ritonavir (100 mg) 1 tab PO BID 5 days. 30 tablet 0   diclofenac Sodium (VOLTAREN) 1 % GEL Apply 4 g topically 4 (four) times daily. 100 g 0    Allergies  Allergen Reactions   Lactose    Wheat Bran     Family History  Problem Relation Age of Onset   Arthritis Mother     Social History   Socioeconomic History   Marital status: Single    Spouse name: Not on file   Number of children: Not on file   Years of education: Not on file   Highest education level: Not on file  Occupational History   Not on file  Tobacco Use    Smoking status: Former   Smokeless tobacco: Never  Vaping Use   Vaping Use: Never used  Substance and Sexual Activity   Alcohol use: No    Alcohol/week: 0.0 standard drinks of alcohol   Drug use: No   Sexual activity: Not on file  Other Topics Concern   Not on file  Social History Narrative   Not on file   Social Determinants of Health   Financial Resource Strain: Not on file  Food Insecurity: Not on file  Transportation Needs: Not on file  Physical Activity: Not on file  Stress: Not on file  Social Connections: Not on file  Intimate Partner Violence: Not on file     Constitutional: Denies fever, malaise, fatigue, headache or abrupt weight changes.  HEENT: Patient reports runny nose and sore throat.  Denies eye pain, eye redness, ear pain, ringing in the ears, wax buildup, nasal congestion, bloody nose. Respiratory: Patient reports cough.  Denies difficulty breathing, shortness of breath, or sputum production.   Cardiovascular: Denies chest pain, chest tightness, palpitations or swelling in the hands or feet.  Gastrointestinal: Denies abdominal pain, bloating, constipation, diarrhea or blood in the stool.   No other specific complaints in a complete review of systems (except as listed in HPI above).  Physical Exam Triage Vital Signs ED Triage Vitals  Enc Vitals Group     BP 07/03/22 1145 129/71     Pulse Rate 07/03/22 1145 78     Resp 07/03/22 1145 14     Temp 07/03/22 1145 99.4 F (37.4 C)     Temp Source 07/03/22 1145 Oral     SpO2 07/03/22 1145 100 %     Weight 07/03/22 1142 190 lb 0.6 oz (86.2 kg)     Height 07/03/22 1142 5\' 8"  (1.727 m)     Head Circumference --      Peak Flow --      Pain Score 07/03/22 1142 0     Pain Loc --      Pain Edu? --      Excl. in [place]? --    No data found.  Updated Vital Signs BP 129/71 (BP Location: Left Arm)   Pulse 78   Temp 99.4 F (37.4 C) (Oral)   Resp 14   Ht 5\' 8"  (1.727 m)   Wt 190 lb 0.6 oz (86.2 kg)   SpO2  100%   BMI 28.89 kg/m      Physical Exam  BP 129/71 (BP Location: Left Arm)  Pulse 78   Temp 99.4 F (37.4 C) (Oral)   Resp 14   Ht 5\' 8"  (1.727 m)   Wt 190 lb 0.6 oz (86.2 kg)   SpO2 100%   BMI 28.89 kg/m  Wt Readings from Last 3 Encounters:  07/03/22 190 lb 0.6 oz (86.2 kg)  01/24/22 190 lb (86.2 kg)  12/11/21 189 lb 6 oz (85.9 kg)    General: Appears her stated age, overweight, in NAD. Skin: Warm, dry and intact.  HEENT: Head: normal shape and size; Eyes: sclera white, no icterus, conjunctiva pink, PERRLA and EOMs intact;  Throat/Mouth: Teeth present, mucosa erythematous and moist, no exudate, lesions or ulcerations noted.  Neck: No adenopathy noted. Cardiovascular: Normal rate and rhythm. S1,S2 noted.  No murmur, rubs or gallops noted.  Pulmonary/Chest: Normal effort and positive vesicular breath sounds. No respiratory distress. No wheezes, rales or ronchi noted.   Neurological: Alert and oriented.   BMET    Component Value Date/Time   NA 143 01/21/2022 0925   K 4.3 01/21/2022 0925   CL 106 01/21/2022 0925   CO2 22 01/21/2022 0925   GLUCOSE 92 01/21/2022 0925   BUN 19 01/21/2022 0925   CREATININE 0.89 01/21/2022 0925   CALCIUM 9.2 01/21/2022 0925   GFRNONAA 48 (L) 11/23/2020 1143   GFRAA 55 (L) 11/23/2020 1143    Lipid Panel     Component Value Date/Time   CHOL 185 01/21/2022 0925   TRIG 87 01/21/2022 0925   HDL 63 01/21/2022 0925   LDLCALC 106 (H) 01/21/2022 0925    CBC    Component Value Date/Time   WBC 7.5 01/21/2022 0925   RBC 3.64 (L) 01/21/2022 0925   HGB 12.2 01/21/2022 0925   HCT 35.5 01/21/2022 0925   PLT 230 01/21/2022 0925   MCV 98 (H) 01/21/2022 0925   MCH 33.5 (H) 01/21/2022 0925   MCHC 34.4 01/21/2022 0925   RDW 11.2 (L) 01/21/2022 0925   LYMPHSABS 1.8 01/21/2022 0925   EOSABS 0.3 01/21/2022 0925   BASOSABS 0.1 01/21/2022 0925    Hgb A1C No results found for: "HGBA1C"     UC Treatments / Results  Labs  Labs Reviewed   RESP PANEL BY RT-PCR (FLU A&B, COVID) ARPGX2 - Abnormal; Notable for the following components:      Result Value   SARS Coronavirus 2 by RT PCR POSITIVE (*)    All other components within normal limits  GROUP A STREP BY PCR    Medications Ordered in UC Medications - No data to display  Initial Impression / Assessment and Plan / UC Course  I have reviewed the triage vital signs and the nursing notes.  Pertinent labs & imaging results that were available during my care of the patient were reviewed by me and considered in my medical decision making (see chart for details).    Runny Nose, Sore Throat and Cough:  DDx include viral URI with cough, allergic rhinitis, influenza, COVID Rapid flu/COVID test positive for covid Rapid strep test negative Encouraged rest and fluids Encouraged salt water gargles for the sore throat RX for Paxolovid sent to pharmacy, kidney function reviewed Can take Mucinex 1200 mg BID as needed for sore throat and cough Can take Flonase 1 spray each nostril daily x1 week for runny nose RX for Tessalon 100 mg TID for cough  Final Clinical Impressions(s) / UC Diagnoses   Final diagnoses:  [id number]     Discharge Instructions      You  were seen today for URI symptoms.  Your COVID test is positive.  I have sent in an oral antiviral for you to take as directed for the next 5 days.  I have also sent in a cough tablet for you to take to help suppress your cough.  You may also take Mucinex and Flonase OTC according to the package directions.  Please follow-up with your PCP if symptoms persist or worsen.     ED Prescriptions     Medication Sig Dispense Auth. Provider   nirmatrelvir/ritonavir EUA (PAXLOVID) 20 x 150 MG & 10 x 100MG  TABS Take 3 tablets by mouth 2 (two) times daily for 5 days. Patient GFR is > 60.  nirmatrelvir (150 mg) 2 tabs PO BID x 5 days and ritonavir (100 mg) 1 tab PO BID 5 days. 30 tablet Jearld Fenton, NP   benzonatate (TESSALON) 100  MG capsule Take 1 capsule (100 mg total) by mouth every 8 (eight) hours. 21 capsule Jearld Fenton, NP      PDMP not reviewed this encounter.   Jearld Fenton, NP 07/03/22 1238

## 2022-07-03 NOTE — ED Triage Notes (Signed)
Patient c/o runny nose, cough and congestion that started last night.  Patient unsure of fevers.

## 2022-07-03 NOTE — Discharge Instructions (Addendum)
You were seen today for URI symptoms.  Your COVID test is positive.  I have sent in an oral antiviral for you to take as directed for the next 5 days.  I have also sent in a cough tablet for you to take to help suppress your cough.  You may also take Mucinex and Flonase OTC according to the package directions.  Please follow-up with your PCP if symptoms persist or worsen.

## 2022-07-25 ENCOUNTER — Encounter: Payer: Self-pay | Admitting: Physician Assistant

## 2022-07-25 ENCOUNTER — Ambulatory Visit (INDEPENDENT_AMBULATORY_CARE_PROVIDER_SITE_OTHER): Payer: Medicare Other | Admitting: Physician Assistant

## 2022-07-25 ENCOUNTER — Other Ambulatory Visit: Payer: Self-pay | Admitting: Physician Assistant

## 2022-07-25 VITALS — BP 127/72 | HR 82 | Temp 97.9°F | Resp 16 | Ht 68.5 in | Wt 189.2 lb

## 2022-07-25 DIAGNOSIS — M25562 Pain in left knee: Secondary | ICD-10-CM | POA: Diagnosis not present

## 2022-07-25 DIAGNOSIS — G8929 Other chronic pain: Secondary | ICD-10-CM

## 2022-07-25 DIAGNOSIS — Z23 Encounter for immunization: Secondary | ICD-10-CM

## 2022-07-25 DIAGNOSIS — E039 Hypothyroidism, unspecified: Secondary | ICD-10-CM | POA: Diagnosis not present

## 2022-07-25 DIAGNOSIS — M25561 Pain in right knee: Secondary | ICD-10-CM

## 2022-07-25 MED ORDER — PNEUMOCOCCAL 20-VAL CONJ VACC 0.5 ML IM SUSY: 0.5000 mL | PREFILLED_SYRINGE | INTRAMUSCULAR | 0 refills | Status: AC

## 2022-07-25 NOTE — Progress Notes (Signed)
[facility] [address] [place], [place] [address]  Internal MEDICINE  Office Visit Note  Patient Name: Karen Hess  [id number]  [id number]  Date of Service: 08/07/2022  Chief Complaint  Patient presents with   Follow-up   Quality Metric Gaps    Shingles, Pneumonia and Tetanus vaccines    HPI Pt is here for routine follow up -Had covid a few weeks ago, feels well. Still a lingering cough -Sleeping ok -Knees still bothersome, using 4 point cane for balance. -Good appetite -due for PNA vaccine and flu vaccine and would like this today  Current Medication: Outpatient Encounter Medications as of 07/25/2022  Medication Sig   benzonatate (TESSALON) 100 MG capsule Take 1 capsule (100 mg total) by mouth every 8 (eight) hours.   diclofenac Sodium (VOLTAREN) 1 % GEL Apply 4 g topically 4 (four) times daily.   levothyroxine (SYNTHROID) 50 MCG tablet TAKE 1 TABLET BY MOUTH EVERY DAY BEFORE BREAKFAST   liothyronine (CYTOMEL) 25 MCG tablet TAKE 1/2 TABLET BY MOUTH EVERY DAY   [DISCONTINUED] pneumococcal 20-valent conjugate vaccine (PREVNAR 20) 0.5 ML injection Inject 0.5 mLs into the muscle tomorrow at 10 am.   [EXPIRED] pneumococcal 20-valent conjugate vaccine (PREVNAR 20) 0.5 ML injection Inject 0.5 mLs into the muscle tomorrow at 10 am for 1 dose.   No facility-administered encounter medications on file as of 07/25/2022.    Surgical History: Past Surgical History:  Procedure Laterality Date   BREAST BIOPSY     NO PAST SURGERIES      Medical History: Past Medical History:  Diagnosis Date   Hypothyroid     Family History: Family History  Problem Relation Age of Onset   Arthritis Mother     Social History   Socioeconomic History   Marital status: Single    Spouse name: Not on file   Number of children: Not on file   Years of education: Not on file   Highest education level: Not on file  Occupational History   Not on file  Tobacco Use   Smoking  status: Former   Smokeless tobacco: Never  Vaping Use   Vaping Use: Never used  Substance and Sexual Activity   Alcohol use: No    Alcohol/week: 0.0 standard drinks of alcohol   Drug use: No   Sexual activity: Not on file  Other Topics Concern   Not on file  Social History Narrative   Not on file   Social Determinants of Health   Financial Resource Strain: Not on file  Food Insecurity: Not on file  Transportation Needs: Not on file  Physical Activity: Not on file  Stress: Not on file  Social Connections: Not on file  Intimate Partner Violence: Not on file      Review of Systems  Constitutional:  Negative for chills, fatigue and unexpected weight change.  HENT:  Negative for congestion, postnasal drip, rhinorrhea, sneezing and sore throat.   Eyes:  Negative for redness.  Respiratory:  Positive for cough. Negative for chest tightness and shortness of breath.   Cardiovascular:  Negative for chest pain and palpitations.  Gastrointestinal:  Negative for abdominal pain, constipation, diarrhea, nausea and vomiting.  Genitourinary:  Negative for dysuria and frequency.  Musculoskeletal:  Positive for arthralgias, gait problem and myalgias. Negative for back pain, joint swelling and neck pain.       Chronic bilateral knee pain, using 4 point cane to help with stability and mobility due to knee pain  Skin:  Negative for rash.  Neurological:  Negative for tremors and numbness.  Hematological:  Negative for adenopathy. Does not bruise/bleed easily.  Psychiatric/Behavioral:  Negative for behavioral problems (Depression), sleep disturbance and suicidal ideas. The patient is not nervous/anxious.     Vital Signs: BP 127/72   Pulse 82   Temp 97.9 F (36.6 C)   Resp 16   Ht 5' 8.5" (1.74 m)   Wt 189 lb 3.2 oz (85.8 kg)   SpO2 97%   BMI 28.35 kg/m    Physical Exam Constitutional:      General: She is not in acute distress.    Appearance: She is well-developed. She is obese. She  is not diaphoretic.  HENT:     Head: Normocephalic and atraumatic.     Right Ear: External ear normal.     Left Ear: External ear normal.     Nose: Nose normal.     Mouth/Throat:     Pharynx: No oropharyngeal exudate.  Eyes:     General: No scleral icterus.       Right eye: No discharge.        Left eye: No discharge.     Conjunctiva/sclera: Conjunctivae normal.     Pupils: Pupils are equal, round, and reactive to light.  Neck:     Thyroid: No thyromegaly.     Vascular: No JVD.     Trachea: No tracheal deviation.  Cardiovascular:     Rate and Rhythm: Normal rate and regular rhythm.     Heart sounds: Normal heart sounds. No murmur heard.    No friction rub. No gallop.  Pulmonary:     Effort: Pulmonary effort is normal. No respiratory distress.     Breath sounds: Normal breath sounds. No stridor. No wheezing or rales.  Chest:     Chest wall: No tenderness.  Breasts:    Right: Normal. No mass.     Left: Normal. No mass.  Abdominal:     General: Bowel sounds are normal. There is no distension.     Palpations: Abdomen is soft. There is no mass.     Tenderness: There is no abdominal tenderness. There is no guarding or rebound.  Musculoskeletal:        General: No tenderness or deformity. Normal range of motion.     Cervical back: Normal range of motion and neck supple.     Comments: Pain with movement in knees, non TTP  Lymphadenopathy:     Cervical: No cervical adenopathy.  Skin:    General: Skin is warm and dry.     Coloration: Skin is not pale.     Findings: No erythema or rash.  Neurological:     Mental Status: She is alert and oriented to person, place, and time.     Cranial Nerves: No cranial nerve deficit.     Motor: No abnormal muscle tone.     Coordination: Coordination normal.     Gait: Gait abnormal.     Deep Tendon Reflexes: Reflexes are normal and symmetric.     Comments: Walks with 4 point cane due to chronic pain in knees  Psychiatric:        Behavior:  Behavior normal.        Thought Content: Thought content normal.        Judgment: Judgment normal.        Assessment/Plan: 1. Chronic pain of both knees Continue tylenol as needed and follow up with ortho  2. Acquired hypothyroidism Continue  current medications  3. Flu vaccine need - Flu Vaccine MDCK QUAD PF   General Counseling: Karen Hess verbalizes understanding of the findings of todays visit and agrees with plan of treatment. I have discussed any further diagnostic evaluation that may be needed or ordered today. We also reviewed her medications today. she has been encouraged to call the office with any questions or concerns that should arise related to todays visit.    Orders Placed This Encounter  Procedures   Flu Vaccine MDCK QUAD PF    Meds ordered this encounter  Medications   pneumococcal 20-valent conjugate vaccine (PREVNAR 20) 0.5 ML injection    Sig: Inject 0.5 mLs into the muscle tomorrow at 10 am for 1 dose.    Dispense:  0.5 mL    Refill:  0    This patient was seen by Drema Dallas, PA-C in collaboration with Dr. Clayborn Bigness as a part of collaborative care agreement.   Total time spent:30 Minutes Time spent includes review of chart, medications, test results, and follow up plan with the patient.      Dr Lavera Guise Internal medicine

## 2023-01-26 ENCOUNTER — Ambulatory Visit: Payer: Medicare PPO | Admitting: Physician Assistant

## 2023-02-06 ENCOUNTER — Ambulatory Visit: Payer: Medicare PPO | Admitting: Physician Assistant

## 2023-02-20 ENCOUNTER — Other Ambulatory Visit: Payer: Self-pay | Admitting: Physician Assistant

## 2023-02-20 DIAGNOSIS — E039 Hypothyroidism, unspecified: Secondary | ICD-10-CM

## 2023-03-03 ENCOUNTER — Telehealth: Payer: Self-pay | Admitting: Physician Assistant

## 2023-03-03 NOTE — Telephone Encounter (Signed)
S/w pt to schedule aw[facility] but pt stated that she goes with another program and that she didn't want to leave [place] but they gave her a deal-nm

## 2023-03-10 ENCOUNTER — Ambulatory Visit (INDEPENDENT_AMBULATORY_CARE_PROVIDER_SITE_OTHER): Payer: Medicare PPO | Admitting: Physician Assistant

## 2023-03-10 ENCOUNTER — Encounter: Payer: Self-pay | Admitting: Physician Assistant

## 2023-03-10 VITALS — BP 115/60 | HR 82 | Temp 98.6°F | Resp 16 | Ht 68.5 in | Wt 204.6 lb

## 2023-03-10 DIAGNOSIS — E2839 Other primary ovarian failure: Secondary | ICD-10-CM

## 2023-03-10 DIAGNOSIS — Z0001 Encounter for general adult medical examination with abnormal findings: Secondary | ICD-10-CM | POA: Diagnosis not present

## 2023-03-10 DIAGNOSIS — N1831 Chronic kidney disease, stage 3a: Secondary | ICD-10-CM | POA: Diagnosis not present

## 2023-03-10 DIAGNOSIS — E559 Vitamin D deficiency, unspecified: Secondary | ICD-10-CM

## 2023-03-10 DIAGNOSIS — R5383 Other fatigue: Secondary | ICD-10-CM

## 2023-03-10 DIAGNOSIS — R300 Dysuria: Secondary | ICD-10-CM

## 2023-03-10 DIAGNOSIS — E039 Hypothyroidism, unspecified: Secondary | ICD-10-CM

## 2023-03-10 DIAGNOSIS — I5189 Other ill-defined heart diseases: Secondary | ICD-10-CM

## 2023-03-10 DIAGNOSIS — M25561 Pain in right knee: Secondary | ICD-10-CM

## 2023-03-10 DIAGNOSIS — G8929 Other chronic pain: Secondary | ICD-10-CM

## 2023-03-10 DIAGNOSIS — E782 Mixed hyperlipidemia: Secondary | ICD-10-CM

## 2023-03-10 DIAGNOSIS — M85839 Other specified disorders of bone density and structure, unspecified forearm: Secondary | ICD-10-CM

## 2023-03-10 DIAGNOSIS — M25562 Pain in left knee: Secondary | ICD-10-CM

## 2023-03-10 NOTE — Progress Notes (Signed)
[facility] [address] [place], [place] [address]  Internal MEDICINE  Office Visit Note  Patient Name: Karen Hess  [id number]  [id number]  Date of Service: 03/22/2023  Chief Complaint  Patient presents with   Medicare Wellness     HPI Pt is here for routine health maintenance examination and has no new concerns today -Does well walking with cane and feels stable -some LE swelling, does not elevated legs and will try this and consider compression stockings -Does continue to have knee pain from OA in both knees and previously received injections with ortho, but has not had this in awhile. She was worried about side effects of too many injections, but is now weighting the benefits vs risks of treatment. She is considering following up with them -she is due for bone density exam due to osteopenia previously -BP stable -sleeping and eating well -Due for routine fasting labs     03/10/2023   11:14 AM 01/24/2022    2:32 PM 11/16/2020   11:33 AM 11/12/2019   11:04 AM 11/09/2018   10:18 AM  MMSE - Mini Mental State Exam  Orientation to time 5 5 5 5 5   Orientation to Place 5 5 5 5 5   Registration 3 3 3 3 3   Attention/ Calculation 5 5 5 5 5   Recall 3 3 3 3 3   Language- name 2 objects 2 2 2 2 2   Language- repeat 1 1 1 1 1   Language- follow 3 step command 3 3 3 3 3   Language- read & follow direction 1 1 1 1 1   Write a sentence 0 1 0 1 1  Copy design 1 1 1 1 1   Total score 29 30 29 30 30     Current Medication: Outpatient Encounter Medications as of 03/10/2023  Medication Sig   benzonatate (TESSALON) 100 MG capsule Take 1 capsule (100 mg total) by mouth every 8 (eight) hours.   diclofenac Sodium (VOLTAREN) 1 % GEL Apply 4 g topically 4 (four) times daily.   levothyroxine (SYNTHROID) 50 MCG tablet TAKE 1 TABLET BY MOUTH EVERY DAY BEFORE BREAKFAST   liothyronine (CYTOMEL) 25 MCG tablet TAKE 1/2 TABLET BY MOUTH EVERY DAY   No facility-administered encounter medications  on file as of 03/10/2023.    Surgical History: Past Surgical History:  Procedure Laterality Date   BREAST BIOPSY     NO PAST SURGERIES      Medical History: Past Medical History:  Diagnosis Date   Hypothyroid     Family History: Family History  Problem Relation Age of Onset   Arthritis Mother       Review of Systems  Constitutional:  Negative for chills, fatigue and unexpected weight change.  HENT:  Negative for congestion, postnasal drip, rhinorrhea, sneezing and sore throat.   Eyes:  Negative for redness.  Respiratory:  Negative for chest tightness and shortness of breath.   Cardiovascular:  Negative for chest pain and palpitations.  Gastrointestinal:  Negative for abdominal pain, constipation, diarrhea, nausea and vomiting.  Genitourinary:  Negative for dysuria and frequency.  Musculoskeletal:  Positive for arthralgias, gait problem and myalgias. Negative for back pain, joint swelling and neck pain.       Chronic bilateral knee pain, using 4 point cane to help with stability and mobility due to knee pain  Skin:  Negative for rash.  Neurological:  Negative for tremors and numbness.  Hematological:  Negative for adenopathy. Does not bruise/bleed easily.  Psychiatric/Behavioral:  Negative for  behavioral problems (Depression), sleep disturbance and suicidal ideas. The patient is not nervous/anxious.      Vital Signs: BP 115/60   Pulse 82   Temp 98.6 F (37 C)   Resp 16   Ht 5' 8.5" (1.74 m)   Wt 204 lb 9.6 oz (92.8 kg)   SpO2 95%   BMI 30.66 kg/m    Physical Exam Constitutional:      General: She is not in acute distress.    Appearance: She is well-developed. She is obese. She is not diaphoretic.  HENT:     Head: Normocephalic and atraumatic.     Right Ear: External ear normal.     Left Ear: External ear normal.     Nose: Nose normal.     Mouth/Throat:     Pharynx: No oropharyngeal exudate.  Eyes:     General: No scleral icterus.       Right eye: No  discharge.        Left eye: No discharge.     Conjunctiva/sclera: Conjunctivae normal.     Pupils: Pupils are equal, round, and reactive to light.  Neck:     Thyroid: No thyromegaly.     Vascular: No JVD.     Trachea: No tracheal deviation.  Cardiovascular:     Rate and Rhythm: Normal rate and regular rhythm.     Heart sounds: Normal heart sounds. No murmur heard.    No friction rub. No gallop.  Pulmonary:     Effort: Pulmonary effort is normal. No respiratory distress.     Breath sounds: Normal breath sounds. No stridor. No wheezing or rales.  Chest:     Chest wall: No tenderness.  Breasts:    Right: Normal. No mass.     Left: Normal. No mass.  Abdominal:     General: Bowel sounds are normal. There is no distension.     Palpations: Abdomen is soft. There is no mass.     Tenderness: There is no abdominal tenderness. There is no guarding or rebound.  Musculoskeletal:        General: No tenderness or deformity. Normal range of motion.     Cervical back: Normal range of motion and neck supple.     Comments: Pain with movement in knees, non TTP  Lymphadenopathy:     Cervical: No cervical adenopathy.  Skin:    General: Skin is warm and dry.     Coloration: Skin is not pale.     Findings: No erythema or rash.  Neurological:     Mental Status: She is alert and oriented to person, place, and time.     Cranial Nerves: No cranial nerve deficit.     Motor: No abnormal muscle tone.     Coordination: Coordination normal.     Gait: Gait abnormal.     Deep Tendon Reflexes: Reflexes are normal and symmetric.     Comments: Walks with 4 point cane due to chronic pain in knees  Psychiatric:        Behavior: Behavior normal.        Thought Content: Thought content normal.        Judgment: Judgment normal.      LABS: Recent Results (from the past 2160 hour(s))  UA/M w/rflx Culture, Routine     Status: Abnormal   Collection Time: 03/10/23 12:33 PM   Specimen: Urine   Urine  Result  Value Ref Range   Specific Gravity, UA 1.018 1.005 - 1.030  pH, UA 7.5 5.0 - 7.5   Color, UA Yellow Yellow   Appearance Ur Cloudy (A) Clear   Leukocytes,UA 1+ (A) Negative   Protein,UA Negative Negative/Trace   Glucose, UA Negative Negative   Ketones, UA Negative Negative   RBC, UA Negative Negative   Bilirubin, UA Negative Negative   Urobilinogen, Ur 0.2 0.2 - 1.0 mg/dL   Nitrite, UA Positive (A) Negative   Microscopic Examination See below:     Comment: Microscopic was indicated and was performed.   Urinalysis Reflex Comment     Comment: This specimen has reflexed to a Urine Culture.  Microscopic Examination     Status: Abnormal   Collection Time: 03/10/23 12:33 PM   Urine  Result Value Ref Range   WBC, UA 11-30 (A) 0 - 5 /hpf   RBC, Urine 0-2 0 - 2 /hpf   Epithelial Cells (non renal) 0-10 0 - 10 /hpf   Casts None seen None seen /lpf   Bacteria, UA Many (A) None seen/Few  Urine Culture, Reflex     Status: Abnormal   Collection Time: 03/10/23 12:33 PM   Urine  Result Value Ref Range   Urine Culture, Routine Final report (A)    Organism ID, Bacteria Comment (A)     Comment: Staphylococcus simulans Based on resistance to oxacillin this isolate would be resistant to all currently available beta-lactam antimicrobial agents, with the exception of the newer cephalosporins with anti-MRSA activity, such as Ceftaroline Greater than 100,000 colony forming units per mL    Antimicrobial Susceptibility Comment     Comment:       ** S = Susceptible; I = Intermediate; R = Resistant **                    P = Positive; N = Negative             MICS are expressed in micrograms per mL    Antibiotic                 RSLT#1    RSLT#2    RSLT#3    RSLT#4 Ciprofloxacin                  S Gentamicin                     S Levofloxacin                   S Moxifloxacin                   S Nitrofurantoin                 S Oxacillin                      R Rifampin                        S Tetracycline                   S Trimethoprim/Sulfa             S Vancomycin                     S         Assessment/Plan: 1. Encounter for general adult medical examination with abnormal findings CPE performed, routine labs ordered  2. Acquired hypothyroidism Will update labs  and adjust as indicated - TSH+T4F+T3Free  3. Diastolic dysfunction BP stable and no SOB, continue to monitor  4. Chronic pain of both knees Will follow up with ortho  5. Stage 3a chronic kidney disease (HCC) - Comprehensive metabolic panel  6. Mixed hyperlipidemia - Lipid Panel With LDL/HDL Ratio  7. Other primary ovarian failure - DG Bone Density; Future  8. Osteopenia of forearm, unspecified laterality - DG Bone Density; Future  9. Vitamin D deficiency - VITAMIN D 25 Hydroxy (Vit-D Deficiency, Fractures)  10. Other fatigue - CBC w/Diff/Platelet - Comprehensive metabolic panel - [id number] - VITAMIN D 25 Hydroxy (Vit-D Deficiency, Fractures) - Lipid Panel With LDL/HDL Ratio  11. Dysuria - UA/M w/rflx Culture, Routine   General Counseling: Elizeth verbalizes understanding of the findings of todays visit and agrees with plan of treatment. I have discussed any further diagnostic evaluation that may be needed or ordered today. We also reviewed her medications today. she has been encouraged to call the office with any questions or concerns that should arise related to todays visit.    Counseling:    Orders Placed This Encounter  Procedures   Microscopic Examination   Urine Culture, Reflex   DG Bone Density   CBC w/Diff/Platelet   Comprehensive metabolic panel   [id number]   VITAMIN D 25 Hydroxy (Vit-D Deficiency, Fractures)   Lipid Panel With LDL/HDL Ratio   UA/M w/rflx Culture, Routine    No orders of the defined types were placed in this encounter.   This patient was seen by Lynn Ito, PA-C in collaboration with Dr. Beverely Risen as a part of collaborative  care agreement.  Total time spent:35 Minutes  Time spent includes review of chart, medications, test results, and follow up plan with the patient.     Lyndon Code, MD  Internal Medicine

## 2023-03-11 LAB — MICROSCOPIC EXAMINATION: Casts: NONE SEEN /lpf

## 2023-03-11 LAB — URINE CULTURE, REFLEX

## 2023-03-14 ENCOUNTER — Telehealth: Payer: Self-pay

## 2023-03-14 LAB — MICROSCOPIC EXAMINATION

## 2023-03-14 LAB — UA/M W/RFLX CULTURE, ROUTINE
Bilirubin, UA: NEGATIVE
Glucose, UA: NEGATIVE
Ketones, UA: NEGATIVE
Nitrite, UA: POSITIVE — AB
Protein,UA: NEGATIVE
RBC, UA: NEGATIVE
Specific Gravity, UA: 1.018 (ref 1.005–1.030)
Urobilinogen, Ur: 0.2 mg/dL (ref 0.2–1.0)
pH, UA: 7.5 (ref 5.0–7.5)

## 2023-03-14 LAB — URINE CULTURE, REFLEX

## 2023-03-14 MED ORDER — NITROFURANTOIN MONOHYD MACRO 100 MG PO CAPS: 100.0000 mg | ORAL_CAPSULE | Freq: Two times a day (BID) | ORAL | 0 refills | Status: DC

## 2023-03-14 NOTE — Telephone Encounter (Signed)
-----   Message from Carlean Jews, PA-C sent at 03/14/2023 12:39 PM EDT ----- Please let her know she has a UTI and send macrobid 100mg  BID x 10 days (#20)

## 2023-03-14 NOTE — Telephone Encounter (Signed)
Tried to call patient to let her know that she has a UTI and that I am sending Macrobid 100mg  to her pharmacy, her mailbox was full.

## 2023-05-03 ENCOUNTER — Other Ambulatory Visit: Payer: Medicare Other

## 2023-05-15 ENCOUNTER — Encounter: Payer: Self-pay | Admitting: Physician Assistant

## 2023-05-15 ENCOUNTER — Ambulatory Visit (INDEPENDENT_AMBULATORY_CARE_PROVIDER_SITE_OTHER): Payer: Medicare PPO | Admitting: Physician Assistant

## 2023-05-15 VITALS — BP 120/63 | HR 83 | Temp 97.6°F | Resp 16 | Ht 68.5 in | Wt 191.0 lb

## 2023-05-15 DIAGNOSIS — E039 Hypothyroidism, unspecified: Secondary | ICD-10-CM | POA: Diagnosis not present

## 2023-05-15 DIAGNOSIS — M85839 Other specified disorders of bone density and structure, unspecified forearm: Secondary | ICD-10-CM | POA: Diagnosis not present

## 2023-05-15 NOTE — Progress Notes (Signed)
[facility] [address] [place], [place] [address]  Internal MEDICINE  Office Visit Note  Patient Name: Karen Hess  [id number]  [id number]  Date of Service: 05/15/2023  Chief Complaint  Patient presents with   Follow-up    HPI Pt is here for routine follow up, unfortunately did not have labs or testing done to review -Did have shots in both knees with ortho and goes back in Oct -forgot about labs and will go for this now, also forgot about bone density and needs this rescheduled -Has been taking calcium and vit D  Current Medication: Outpatient Encounter Medications as of 05/15/2023  Medication Sig   benzonatate (TESSALON) 100 MG capsule Take 1 capsule (100 mg total) by mouth every 8 (eight) hours.   diclofenac Sodium (VOLTAREN) 1 % GEL Apply 4 g topically 4 (four) times daily.   levothyroxine (SYNTHROID) 50 MCG tablet TAKE 1 TABLET BY MOUTH EVERY DAY BEFORE BREAKFAST   liothyronine (CYTOMEL) 25 MCG tablet TAKE 1/2 TABLET BY MOUTH EVERY DAY   nitrofurantoin, macrocrystal-monohydrate, (MACROBID) 100 MG capsule Take 1 capsule (100 mg total) by mouth 2 (two) times daily.   No facility-administered encounter medications on file as of 05/15/2023.    Surgical History: Past Surgical History:  Procedure Laterality Date   BREAST BIOPSY     NO PAST SURGERIES      Medical History: Past Medical History:  Diagnosis Date   Hypothyroid     Family History: Family History  Problem Relation Age of Onset   Arthritis Mother     Social History   Socioeconomic History   Marital status: Single    Spouse name: Not on file   Number of children: Not on file   Years of education: Not on file   Highest education level: Not on file  Occupational History   Not on file  Tobacco Use   Smoking status: Former   Smokeless tobacco: Never  Vaping Use   Vaping status: Never Used  Substance and Sexual Activity   Alcohol use: No    Alcohol/week: 0.0 standard drinks of  alcohol   Drug use: No   Sexual activity: Not on file  Other Topics Concern   Not on file  Social History Narrative   Not on file   Social Determinants of Health   Financial Resource Strain: Not on file  Food Insecurity: Not on file  Transportation Needs: Not on file  Physical Activity: Not on file  Stress: Not on file  Social Connections: Not on file  Intimate Partner Violence: Not on file      Review of Systems  Constitutional:  Negative for chills, fatigue and unexpected weight change.  HENT:  Negative for congestion, postnasal drip, rhinorrhea, sneezing and sore throat.   Eyes:  Negative for redness.  Respiratory:  Negative for chest tightness and shortness of breath.   Cardiovascular:  Negative for chest pain and palpitations.  Gastrointestinal:  Negative for abdominal pain, constipation, diarrhea, nausea and vomiting.  Genitourinary:  Negative for dysuria and frequency.  Musculoskeletal:  Positive for arthralgias, gait problem and myalgias. Negative for back pain, joint swelling and neck pain.       Chronic bilateral knee pain, using 4 point cane to help with stability and mobility due to knee pain  Skin:  Negative for rash.  Neurological:  Negative for tremors and numbness.  Hematological:  Negative for adenopathy. Does not bruise/bleed easily.  Psychiatric/Behavioral:  Negative for behavioral problems (Depression), sleep disturbance and  suicidal ideas. The patient is not nervous/anxious.     Vital Signs: BP 120/63   Pulse 83   Temp 97.6 F (36.4 C)   Resp 16   Ht 5' 8.5" (1.74 m)   Wt 191 lb (86.6 kg)   SpO2 96%   BMI 28.62 kg/m    Physical Exam Constitutional:      General: She is not in acute distress.    Appearance: She is well-developed. She is obese. She is not diaphoretic.  HENT:     Head: Normocephalic and atraumatic.     Right Ear: External ear normal.     Left Ear: External ear normal.     Nose: Nose normal.     Mouth/Throat:     Pharynx:  No oropharyngeal exudate.  Eyes:     Conjunctiva/sclera: Conjunctivae normal.     Pupils: Pupils are equal, round, and reactive to light.  Neck:     Thyroid: No thyromegaly.     Vascular: No JVD.     Trachea: No tracheal deviation.  Cardiovascular:     Rate and Rhythm: Normal rate and regular rhythm.     Heart sounds: Normal heart sounds. No murmur heard.    No friction rub. No gallop.  Pulmonary:     Effort: Pulmonary effort is normal. No respiratory distress.     Breath sounds: Normal breath sounds. No wheezing.  Chest:  Breasts:    Right: Normal. No mass.     Left: Normal. No mass.  Abdominal:     General: Bowel sounds are normal.     Palpations: Abdomen is soft.  Musculoskeletal:        General: No tenderness or deformity. Normal range of motion.     Cervical back: Normal range of motion and neck supple.     Comments: Pain with movement in knees, non TTP  Lymphadenopathy:     Cervical: No cervical adenopathy.  Skin:    General: Skin is warm and dry.     Coloration: Skin is not pale.     Findings: No erythema or rash.  Neurological:     Mental Status: She is alert and oriented to person, place, and time.     Cranial Nerves: No cranial nerve deficit.     Motor: No abnormal muscle tone.     Coordination: Coordination normal.     Gait: Gait abnormal.     Deep Tendon Reflexes: Reflexes are normal and symmetric.     Comments: Walks with 4 point cane due to chronic pain in knees  Psychiatric:        Behavior: Behavior normal.        Thought Content: Thought content normal.        Judgment: Judgment normal.        Assessment/Plan: 1. Acquired hypothyroidism Will have labs updated  2. Osteopenia of forearm, unspecified laterality Will reschedule bone density, continue vit D and calcium currently   General Counseling: Cordasia verbalizes understanding of the findings of todays visit and agrees with plan of treatment. I have discussed any further diagnostic  evaluation that may be needed or ordered today. We also reviewed her medications today. she has been encouraged to call the office with any questions or concerns that should arise related to todays visit.    No orders of the defined types were placed in this encounter.   No orders of the defined types were placed in this encounter.   This patient was seen by Lynn Ito,  PA-C in collaboration with Dr. Beverely Risen as a part of collaborative care agreement.   Total time spent:30 Minutes Time spent includes review of chart, medications, test results, and follow up plan with the patient.      Dr Lyndon Code Internal medicine

## 2023-05-16 ENCOUNTER — Telehealth: Payer: Self-pay | Admitting: Physician Assistant

## 2023-05-16 NOTE — Telephone Encounter (Signed)
Notified patient of dexa appointment date, arrival time, location-[place]

## 2023-05-23 LAB — TSH+T4F+T3FREE: T3, Free: 4.5 pg/mL — ABNORMAL HIGH (ref 2.0–4.4)

## 2023-05-23 LAB — CBC WITH DIFFERENTIAL/PLATELET
Hematocrit: 35.2 % (ref 34.0–46.6)
Lymphocytes Absolute: 1.3 10*3/uL (ref 0.7–3.1)
Neutrophils Absolute: 4.5 10*3/uL (ref 1.4–7.0)

## 2023-05-23 LAB — COMPREHENSIVE METABOLIC PANEL
BUN: 20 mg/dL (ref 8–27)
eGFR: 49 mL/min/{1.73_m2} — ABNORMAL LOW (ref >59)

## 2023-05-24 ENCOUNTER — Other Ambulatory Visit: Payer: Medicare PPO

## 2023-05-26 ENCOUNTER — Other Ambulatory Visit: Payer: Self-pay | Admitting: Physician Assistant

## 2023-05-26 DIAGNOSIS — N1831 Chronic kidney disease, stage 3a: Secondary | ICD-10-CM

## 2023-05-26 DIAGNOSIS — E039 Hypothyroidism, unspecified: Secondary | ICD-10-CM

## 2023-05-29 ENCOUNTER — Telehealth: Payer: Self-pay

## 2023-05-29 NOTE — Telephone Encounter (Signed)
Pt notified for labs result  and advised to take OTC vitamin D 1000 mg and also discuss  in detailed at next visit and we repeat few weeks

## 2023-05-29 NOTE — Telephone Encounter (Signed)
-----   Message from Carlean Jews sent at 05/26/2023  1:35 PM EDT ----- Vit D low--supplement, kidney function a little worse than last year but more consistent with previous years and will need to monitor. Her thyroid was also borderline high and will plan to recheck labs in a few months

## 2023-06-20 ENCOUNTER — Ambulatory Visit
Admission: RE | Admit: 2023-06-20 | Discharge: 2023-06-20 | Disposition: A | Discharge status: Home / Self Care | Payer: Medicare PPO | Source: Ambulatory Visit | Attending: Physician Assistant | Admitting: Physician Assistant

## 2023-06-20 DIAGNOSIS — E2839 Other primary ovarian failure: Secondary | ICD-10-CM | POA: Diagnosis present

## 2023-06-20 DIAGNOSIS — M85839 Other specified disorders of bone density and structure, unspecified forearm: Secondary | ICD-10-CM | POA: Diagnosis present

## 2023-06-21 ENCOUNTER — Other Ambulatory Visit: Payer: Self-pay | Admitting: Physician Assistant

## 2023-06-21 DIAGNOSIS — E039 Hypothyroidism, unspecified: Secondary | ICD-10-CM

## 2023-07-03 ENCOUNTER — Ambulatory Visit: Payer: Medicare PPO | Admitting: Physician Assistant

## 2023-07-06 ENCOUNTER — Ambulatory Visit: Payer: Medicare PPO | Admitting: Physician Assistant

## 2023-09-29 ENCOUNTER — Other Ambulatory Visit: Payer: Self-pay | Admitting: Physician Assistant

## 2023-09-29 DIAGNOSIS — E039 Hypothyroidism, unspecified: Secondary | ICD-10-CM

## 2023-12-06 ENCOUNTER — Telehealth: Payer: Self-pay | Admitting: Physician Assistant

## 2023-12-06 NOTE — Telephone Encounter (Signed)
 Lvm to move 03/11/24 appointment-Toni

## 2024-03-07 ENCOUNTER — Telehealth: Payer: Self-pay | Admitting: Physician Assistant

## 2024-03-07 NOTE — Telephone Encounter (Signed)
 Left vm to confirm 03/14/24 appointment-Toni

## 2024-03-11 ENCOUNTER — Ambulatory Visit: Payer: Medicare PPO | Admitting: Physician Assistant

## 2024-03-14 ENCOUNTER — Ambulatory Visit: Admitting: Physician Assistant

## 2024-04-04 ENCOUNTER — Ambulatory Visit: Admitting: Physician Assistant

## 2024-04-11 ENCOUNTER — Ambulatory Visit: Admitting: Physician Assistant

## 2024-05-09 ENCOUNTER — Other Ambulatory Visit: Payer: Self-pay | Admitting: Physician Assistant

## 2024-05-09 DIAGNOSIS — E039 Hypothyroidism, unspecified: Secondary | ICD-10-CM

## 2024-05-22 ENCOUNTER — Other Ambulatory Visit: Payer: Self-pay | Admitting: Physician Assistant

## 2024-05-22 DIAGNOSIS — E039 Hypothyroidism, unspecified: Secondary | ICD-10-CM

## 2024-05-27 ENCOUNTER — Ambulatory Visit (INDEPENDENT_AMBULATORY_CARE_PROVIDER_SITE_OTHER): Admitting: Physician Assistant

## 2024-05-27 VITALS — BP 138/72 | HR 83 | Temp 98.0°F | Resp 16 | Ht 67.0 in | Wt 201.0 lb

## 2024-05-27 DIAGNOSIS — Z0000 Encounter for general adult medical examination without abnormal findings: Secondary | ICD-10-CM | POA: Diagnosis not present

## 2024-05-27 DIAGNOSIS — N1831 Chronic kidney disease, stage 3a: Secondary | ICD-10-CM | POA: Diagnosis not present

## 2024-05-27 DIAGNOSIS — E039 Hypothyroidism, unspecified: Secondary | ICD-10-CM | POA: Diagnosis not present

## 2024-05-27 DIAGNOSIS — M810 Age-related osteoporosis without current pathological fracture: Secondary | ICD-10-CM

## 2024-05-27 MED ORDER — LEVOTHYROXINE SODIUM 50 MCG PO TABS: ORAL_TABLET | ORAL | 1 refills | Status: AC

## 2024-05-27 NOTE — Progress Notes (Signed)
 [facility] [address] [place], [place] [address]  Internal MEDICINE  Office Visit Note  Patient Name: Karen Hess  [id number]  [id number]  Date of Service: 05/27/2024  Chief Complaint  Patient presents with   Medicare Wellness    HPI Karen Hess presents for an annual well visit Well-appearing 89 y.o.female  DEXA scan: osteoporosis in left forearm. May discuss consideration of meds once labs done to determine if meds an option Labs: slip given New or worsening pain: left knee pain, states she is seeing a provider in Oct to follow up on this     05/27/2024    1:59 PM 03/10/2023   11:14 AM 01/24/2022    2:32 PM  MMSE - Mini Mental State Exam  Orientation to time 5 5 5   Orientation to Place 5 5 5   Registration 3 3 3   Attention/ Calculation 5 5 5   Recall 3 3 3   Language- name 2 objects 2 2 2   Language- repeat 1 1 1   Language- follow 3 step command 3 3 3   Language- read & follow direction 1 1 1   Write a sentence 1 0 1  Copy design 1 1 1   Total score 30 29 30     Functional Status Survey: Is the patient deaf or have difficulty hearing?: Yes Does the patient have difficulty seeing, even when wearing glasses/contacts?: No Does the patient have difficulty concentrating, remembering, or making decisions?: No Does the patient have difficulty walking or climbing stairs?: Yes Does the patient have difficulty dressing or bathing?: No Does the patient have difficulty doing errands alone such as visiting a doctor's office or shopping?: No     07/03/2022   11:43 AM 07/25/2022    2:41 PM 03/10/2023   11:12 AM 05/15/2023    2:26 PM 05/27/2024    1:59 PM  Fall Risk  Falls in the past year?  0 0 0 0  Was there an injury with Fall?   0    Fall Risk Category Calculator   0    (RETIRED) Patient Fall Risk Level Low fall risk       Patient at Risk for Falls Due to   No Fall Risks    Fall risk Follow up   Falls evaluation completed       Data saved with a previous flowsheet  row definition       05/27/2024    1:59 PM  Depression screen PHQ 2/9  Decreased Interest 0  Down, Depressed, Hopeless 0  PHQ - 2 Score 0        No data to display            Current Medication: Outpatient Encounter Medications as of 05/27/2024  Medication Sig   benzonatate  (TESSALON ) 100 MG capsule Take 1 capsule (100 mg total) by mouth every 8 (eight) hours.   diclofenac  Sodium (VOLTAREN ) 1 % GEL Apply 4 g topically 4 (four) times daily.   liothyronine  (CYTOMEL ) 25 MCG tablet TAKE 1/2 TABLET BY MOUTH DAILY   [DISCONTINUED] levothyroxine  (SYNTHROID ) 50 MCG tablet TAKE 1 TABLET BY MOUTH EVERY DAY BEFORE BREAKFAST   [DISCONTINUED] nitrofurantoin , macrocrystal-monohydrate, (MACROBID ) 100 MG capsule Take 1 capsule (100 mg total) by mouth 2 (two) times daily.   levothyroxine  (SYNTHROID ) 50 MCG tablet TAKE 1 TABLET BY MOUTH EVERY DAY BEFORE BREAKFAST   No facility-administered encounter medications on file as of 05/27/2024.    Surgical History: Past Surgical History:  Procedure Laterality Date   BREAST BIOPSY  NO PAST SURGERIES      Medical History: Past Medical History:  Diagnosis Date   Hypothyroid     Family History: Family History  Problem Relation Age of Onset   Arthritis Mother     Social History   Socioeconomic History   Marital status: Single    Spouse name: Not on file   Number of children: Not on file   Years of education: Not on file   Highest education level: Not on file  Occupational History   Not on file  Tobacco Use   Smoking status: Former   Smokeless tobacco: Never  Vaping Use   Vaping status: Never Used  Substance and Sexual Activity   Alcohol use: No    Alcohol/week: 0.0 standard drinks of alcohol   Drug use: No   Sexual activity: Not on file  Other Topics Concern   Not on file  Social History Narrative   Not on file   Social Drivers of Health   Financial Resource Strain: Not on file  Food Insecurity: Not on file   Transportation Needs: Not on file  Physical Activity: Not on file  Stress: Not on file  Social Connections: Not on file  Intimate Partner Violence: Not on file      Review of Systems  Constitutional:  Negative for chills, fatigue and unexpected weight change.  HENT:  Negative for congestion, postnasal drip, rhinorrhea, sneezing and sore throat.   Eyes:  Negative for redness.  Respiratory:  Negative for chest tightness and shortness of breath.   Cardiovascular:  Negative for chest pain and palpitations.  Gastrointestinal:  Negative for abdominal pain, constipation, diarrhea, nausea and vomiting.  Genitourinary:  Negative for dysuria and frequency.  Musculoskeletal:  Positive for arthralgias, gait problem and myalgias. Negative for back pain, joint swelling and neck pain.       Chronic bilateral knee pain, using 4 point cane to help with stability and mobility due to knee pain  Skin:  Negative for rash.  Neurological:  Negative for tremors and numbness.  Hematological:  Negative for adenopathy. Does not bruise/bleed easily.  Psychiatric/Behavioral:  Negative for behavioral problems (Depression), sleep disturbance and suicidal ideas. The patient is not nervous/anxious.     Vital Signs: BP 138/72 Comment: 145/77  Pulse 83   Temp 98 F (36.7 C)   Resp 16   Ht 5' 7 (1.702 m)   Wt 201 lb (91.2 kg)   SpO2 97%   BMI 31.48 kg/m    Physical Exam Vitals and nursing note reviewed.  Constitutional:      General: She is not in acute distress.    Appearance: She is well-developed. She is obese. She is not diaphoretic.  HENT:     Head: Normocephalic and atraumatic.  Neck:     Thyroid: No thyromegaly.     Vascular: No JVD.     Trachea: No tracheal deviation.  Cardiovascular:     Rate and Rhythm: Normal rate and regular rhythm.     Heart sounds: Normal heart sounds. No murmur heard.    No friction rub. No gallop.  Pulmonary:     Effort: Pulmonary effort is normal. No respiratory  distress.     Breath sounds: Normal breath sounds. No wheezing.  Chest:  Breasts:    Right: Normal. No mass.     Left: Normal. No mass.  Musculoskeletal:        General: No tenderness. Normal range of motion.     Comments: Pain with  movement in knees, non TTP  Skin:    General: Skin is warm and dry.  Neurological:     Mental Status: She is alert and oriented to person, place, and time.     Motor: No abnormal muscle tone.     Gait: Gait abnormal.     Deep Tendon Reflexes: Reflexes are normal and symmetric.     Comments: Walks with 4 point cane due to chronic pain in knees  Psychiatric:        Behavior: Behavior normal.        Thought Content: Thought content normal.        Judgment: Judgment normal.        Assessment/Plan: 1. Encounter for annual wellness exam in Medicare patient (Primary) AWV peformed, lab slip given  2. Acquired hypothyroidism Will update labs and adjust as indicated - levothyroxine  (SYNTHROID ) 50 MCG tablet; TAKE 1 TABLET BY MOUTH EVERY DAY BEFORE BREAKFAST  Dispense: 90 tablet; Refill: 1  3. Stage 3a chronic kidney disease (HCC) Will update labs  4. Osteoporosis of forearm May consider treatment pending labs results and discussion of benefit vs risks    General Counseling: Karen Hess verbalizes understanding of the findings of todays visit and agrees with plan of treatment. I have discussed any further diagnostic evaluation that may be needed or ordered today. We also reviewed her medications today. she has been encouraged to call the office with any questions or concerns that should arise related to todays visit.    No orders of the defined types were placed in this encounter.   Meds ordered this encounter  Medications   levothyroxine  (SYNTHROID ) 50 MCG tablet    Sig: TAKE 1 TABLET BY MOUTH EVERY DAY BEFORE BREAKFAST    Dispense:  90 tablet    Refill:  1    Pt need appt for further refills    Return in about 6 months (around  11/27/2024).   Total time spent:35 Minutes Time spent includes review of chart, medications, test results, and follow up plan with the patient.   [place] Controlled Substance Database was reviewed by me.  This patient was seen by Tinnie Pro, PA-C in collaboration with Dr. Sigrid Bathe as a part of collaborative care agreement.  Tinnie Pro, PA-C Internal medicine

## 2024-06-04 ENCOUNTER — Other Ambulatory Visit: Payer: Self-pay | Admitting: Physician Assistant

## 2024-06-05 ENCOUNTER — Ambulatory Visit: Payer: Self-pay | Admitting: Physician Assistant

## 2024-06-05 LAB — CBC WITH DIFFERENTIAL/PLATELET
Basophils Absolute: 0 x10E3/uL (ref 0.0–0.2)
Basos: 1 %
EOS (ABSOLUTE): 0.2 x10E3/uL (ref 0.0–0.4)
Eos: 3 %
Hematocrit: 37 % (ref 34.0–46.6)
Hemoglobin: 12.2 g/dL (ref 11.1–15.9)
Immature Grans (Abs): 0 x10E3/uL (ref 0.0–0.1)
Immature Granulocytes: 0 %
Lymphocytes Absolute: 1.2 x10E3/uL (ref 0.7–3.1)
Lymphs: 18 %
MCH: 32.3 pg (ref 26.6–33.0)
MCHC: 33 g/dL (ref 31.5–35.7)
MCV: 98 fL — ABNORMAL HIGH (ref 79–97)
Monocytes Absolute: 0.5 x10E3/uL (ref 0.1–0.9)
Monocytes: 8 %
Neutrophils Absolute: 4.5 x10E3/uL (ref 1.4–7.0)
Neutrophils: 69 %
Platelets: 227 x10E3/uL (ref 150–450)
RBC: 3.78 x10E6/uL (ref 3.77–5.28)
RDW: 11.4 % — ABNORMAL LOW (ref 11.7–15.4)
WBC: 6.4 x10E3/uL (ref 3.4–10.8)

## 2024-06-05 LAB — T3: T3, Total: 78 ng/dL (ref 71–180)

## 2024-06-05 LAB — COMPREHENSIVE METABOLIC PANEL WITH GFR
ALT: 13 IU/L (ref 0–32)
AST: 17 IU/L (ref 0–40)
Albumin: 4.5 g/dL (ref 3.7–4.7)
Alkaline Phosphatase: 82 IU/L (ref 44–121)
BUN/Creatinine Ratio: 16 (ref 12–28)
BUN: 19 mg/dL (ref 8–27)
Bilirubin Total: 0.4 mg/dL (ref 0.0–1.2)
CO2: 21 mmol/L (ref 20–29)
Calcium: 9.6 mg/dL (ref 8.7–10.3)
Chloride: 101 mmol/L (ref 96–106)
Creatinine, Ser: 1.16 mg/dL — ABNORMAL HIGH (ref 0.57–1.00)
Globulin, Total: 2.1 g/dL (ref 1.5–4.5)
Glucose: 95 mg/dL (ref 70–99)
Potassium: 4.2 mmol/L (ref 3.5–5.2)
Sodium: 139 mmol/L (ref 134–144)
Total Protein: 6.6 g/dL (ref 6.0–8.5)
eGFR: 46 mL/min/1.73 — ABNORMAL LOW (ref >59)

## 2024-06-05 LAB — LIPID PANEL WITH LDL/HDL RATIO
Cholesterol, Total: 176 mg/dL (ref 100–199)
HDL: 61 mg/dL (ref >39)
LDL Chol Calc (NIH): 105 mg/dL — ABNORMAL HIGH (ref 0–99)
LDL/HDL Ratio: 1.7 ratio (ref 0.0–3.2)
Triglycerides: 51 mg/dL (ref 0–149)
VLDL Cholesterol Cal: 10 mg/dL (ref 5–40)

## 2024-06-05 LAB — VITAMIN D 25 HYDROXY (VIT D DEFICIENCY, FRACTURES): Vit D, 25-Hydroxy: 41.7 ng/mL (ref 30.0–100.0)

## 2024-06-05 LAB — B12 AND FOLATE PANEL
Folate: 17.9 ng/mL (ref >3.0)
Vitamin B-12: >2000 pg/mL — ABNORMAL HIGH (ref 232–1245)

## 2024-06-05 LAB — T4, FREE: Free T4: 0.98 ng/dL (ref 0.82–1.77)

## 2024-06-05 LAB — TSH: TSH: 0.732 u[IU]/mL (ref 0.450–4.500)

## 2024-06-05 NOTE — Telephone Encounter (Signed)
-----   Message from Tinnie MARLA Pro sent at 06/05/2024 10:37 AM EDT ----- Please let her know that overall her labs are stable, renal function still a little reduced. B12 is elevated and may decrease supplement. ----- Message ----- From: Interface, [facility] Lab Results In Sent: 06/05/2024   5:36 AM EDT To: Tinnie MARLA Pro, PA-C

## 2024-06-05 NOTE — Telephone Encounter (Signed)
 Spoke with patient regarding lab results.

## 2024-08-10 ENCOUNTER — Other Ambulatory Visit: Payer: Self-pay | Admitting: Physician Assistant

## 2024-08-10 DIAGNOSIS — E039 Hypothyroidism, unspecified: Secondary | ICD-10-CM

## 2024-11-25 ENCOUNTER — Ambulatory Visit: Admitting: Physician Assistant

## 2025-05-29 ENCOUNTER — Ambulatory Visit: Admitting: Physician Assistant
